# Patient Record
Sex: Female | Born: 1960 | Hispanic: No | Marital: Married | Smoking: Never smoker
Health system: Southern US, Community
[De-identification: ages and names within clinical notes are randomized; demographics above are authoritative.]

## PROBLEM LIST (undated history)

## (undated) DIAGNOSIS — R079 Chest pain, unspecified: Secondary | ICD-10-CM

## (undated) DIAGNOSIS — E785 Hyperlipidemia, unspecified: Secondary | ICD-10-CM

## (undated) DIAGNOSIS — R002 Palpitations: Secondary | ICD-10-CM

## (undated) DIAGNOSIS — T7840XA Allergy, unspecified, initial encounter: Secondary | ICD-10-CM

## (undated) DIAGNOSIS — J45909 Unspecified asthma, uncomplicated: Secondary | ICD-10-CM

## (undated) DIAGNOSIS — J302 Other seasonal allergic rhinitis: Secondary | ICD-10-CM

## (undated) HISTORY — DX: Chest pain, unspecified: R07.9

## (undated) HISTORY — DX: Hyperlipidemia, unspecified: E78.5

## (undated) HISTORY — DX: Allergy, unspecified, initial encounter: T78.40XA

## (undated) HISTORY — DX: Other seasonal allergic rhinitis: J30.2

## (undated) HISTORY — DX: Unspecified asthma, uncomplicated: J45.909

## (undated) HISTORY — DX: Palpitations: R00.2

## (undated) HISTORY — PX: KNEE SURGERY: SHX244

## (undated) HISTORY — PX: TUBAL LIGATION: SHX77

---

## 2000-07-07 ENCOUNTER — Other Ambulatory Visit: Admission: RE | Admit: 2000-07-07 | Discharge: 2000-07-07 | Payer: Self-pay | Admitting: Family Medicine

## 2000-07-13 ENCOUNTER — Encounter: Admission: RE | Admit: 2000-07-13 | Discharge: 2000-07-13 | Payer: Self-pay | Admitting: Family Medicine

## 2000-07-13 ENCOUNTER — Encounter: Payer: Self-pay | Admitting: Family Medicine

## 2002-04-17 ENCOUNTER — Other Ambulatory Visit: Admission: RE | Admit: 2002-04-17 | Discharge: 2002-04-17 | Payer: Self-pay | Admitting: Internal Medicine

## 2003-05-03 HISTORY — PX: ABDOMINAL HYSTERECTOMY: SHX81

## 2003-09-12 ENCOUNTER — Emergency Department (HOSPITAL_COMMUNITY): Admission: EM | Admit: 2003-09-12 | Discharge: 2003-09-12 | Payer: Self-pay | Admitting: Emergency Medicine

## 2004-09-01 ENCOUNTER — Ambulatory Visit: Payer: Self-pay | Admitting: Oncology

## 2004-11-23 ENCOUNTER — Ambulatory Visit: Payer: Self-pay | Admitting: Oncology

## 2005-01-12 ENCOUNTER — Observation Stay (HOSPITAL_COMMUNITY): Admission: RE | Admit: 2005-01-12 | Discharge: 2005-01-13 | Payer: Self-pay | Admitting: Obstetrics and Gynecology

## 2005-02-11 ENCOUNTER — Ambulatory Visit: Payer: Self-pay | Admitting: Oncology

## 2005-04-15 ENCOUNTER — Ambulatory Visit: Payer: Self-pay | Admitting: Oncology

## 2005-07-14 ENCOUNTER — Ambulatory Visit: Payer: Self-pay | Admitting: Oncology

## 2005-09-14 ENCOUNTER — Ambulatory Visit: Payer: Self-pay | Admitting: Oncology

## 2006-04-07 ENCOUNTER — Emergency Department (HOSPITAL_COMMUNITY): Admission: EM | Admit: 2006-04-07 | Discharge: 2006-04-07 | Payer: Self-pay | Admitting: Emergency Medicine

## 2008-05-02 HISTORY — PX: NASAL SINUS SURGERY: SHX719

## 2012-06-22 ENCOUNTER — Ambulatory Visit (INDEPENDENT_AMBULATORY_CARE_PROVIDER_SITE_OTHER): Payer: BC Managed Care – PPO | Admitting: Emergency Medicine

## 2012-06-22 VITALS — BP 120/78 | HR 67 | Temp 97.9°F | Resp 16 | Ht 62.0 in | Wt 151.2 lb

## 2012-06-22 DIAGNOSIS — R002 Palpitations: Secondary | ICD-10-CM

## 2012-06-22 DIAGNOSIS — R079 Chest pain, unspecified: Secondary | ICD-10-CM

## 2012-06-22 LAB — COMPREHENSIVE METABOLIC PANEL
ALT: 9 U/L (ref 0–35)
AST: 17 U/L (ref 0–37)
Albumin: 4.1 g/dL (ref 3.5–5.2)
Alkaline Phosphatase: 58 U/L (ref 39–117)
BUN: 13 mg/dL (ref 6–23)
CO2: 26 mEq/L (ref 19–32)
Calcium: 9.3 mg/dL (ref 8.4–10.5)
Chloride: 105 mEq/L (ref 96–112)
Creat: 0.83 mg/dL (ref 0.50–1.10)
Glucose, Bld: 115 mg/dL — ABNORMAL HIGH (ref 70–99)
Potassium: 3.7 mEq/L (ref 3.5–5.3)
Sodium: 140 mEq/L (ref 135–145)
Total Bilirubin: 0.6 mg/dL (ref 0.3–1.2)
Total Protein: 7.2 g/dL (ref 6.0–8.3)

## 2012-06-22 LAB — POCT CBC
Granulocyte percent: 57.3 %G (ref 37–80)
HCT, POC: 42.6 % (ref 37.7–47.9)
Hemoglobin: 13.8 g/dL (ref 12.2–16.2)
Lymph, poc: 2.3 (ref 0.6–3.4)
MCH, POC: 27 pg (ref 27–31.2)
MCHC: 32.4 g/dL (ref 31.8–35.4)
MCV: 83.3 fL (ref 80–97)
MID (cbc): 0.3 (ref 0–0.9)
MPV: 8.7 fL (ref 0–99.8)
POC Granulocyte: 3.5 (ref 2–6.9)
POC LYMPH PERCENT: 37.1 %L (ref 10–50)
POC MID %: 5.6 %M (ref 0–12)
Platelet Count, POC: 291 10*3/uL (ref 142–424)
RBC: 5.12 M/uL (ref 4.04–5.48)
RDW, POC: 14.5 %
WBC: 6.1 10*3/uL (ref 4.6–10.2)

## 2012-06-22 NOTE — Progress Notes (Signed)
[facility] [address] [place] [address] [phone]  Date:  06/22/2012   Name:  Jody Brooks   DOB:  June 12, 1960   MRN:  [id number]  PCP:  No primary provider on file.    Chief Complaint: Chest Pain   History of Present Illness:  Jody Brooks is a 52 y.o. very pleasant female patient who presents with the following:  Last night had short, seconds only, duration sharp lancinating chest pain.  Associated with period of forceful rapid heartbeats.  No shortness of breath, sweats, nausea or radiation of pain.  Non smoker.  No prior cardiovascular disease, HBP, DM, hyperlipidemia.  No family history of premature MI/death  There is no problem list on file for this patient.   History reviewed. No pertinent past medical history.  Past Surgical History  Procedure Laterality Date  . Abdominal hysterectomy    . Tubal ligation      History  Substance Use Topics  . Smoking status: Never Smoker   . Smokeless tobacco: Not on file  . Alcohol Use: Not on file    Family History  Problem Relation Age of Onset  . Asthma Daughter   . Cancer Brother     No Known Allergies  Medication list has been reviewed and updated.  No current outpatient prescriptions on file prior to visit.   No current facility-administered medications on file prior to visit.    Review of Systems:  As per HPI, otherwise negative.    Physical Examination: Filed Vitals:   06/22/12 1258  BP: 120/78  Pulse: 67  Temp: 97.9 F (36.6 C)  Resp: 16   Filed Vitals:   06/22/12 1258  Height: 5\' 2"  (1.575 m)  Weight: 151 lb 3.2 oz (68.584 kg)   Body mass index is 27.65 kg/(m^2). Ideal Body Weight: Weight in (lb) to have BMI = 25: 136.4  GEN: WDWN, NAD, Non-toxic, A & O x 3 HEENT: Atraumatic, Normocephalic. Neck supple. No masses, No LAD. Ears and Nose: No external deformity. CV: RRR, No M/G/R. No JVD. No thrill. No extra heart sounds. PULM: CTA B, no wheezes,  crackles, rhonchi. No retractions. No resp. distress. No accessory muscle use. ABD: S, NT, ND, +BS. No rebound. No HSM. EXTR: No c/c/e NEURO Normal gait.  PSYCH: Normally interactive. Conversant. Not depressed or anxious appearing.  Calm demeanor.    Assessment and Plan: Palpitations Chest pain Cardiology referral   Carmelina Dane, MD  Results for orders placed in visit on 06/22/12  POCT CBC      Result Value Range   WBC 6.1  4.6 - 10.2 K/uL   Lymph, poc 2.3  0.6 - 3.4   POC LYMPH PERCENT 37.1  10 - 50 %L   MID (cbc) 0.3  0 - 0.9   POC MID % 5.6  0 - 12 %M   POC Granulocyte 3.5  2 - 6.9   Granulocyte percent 57.3  37 - 80 %G   RBC 5.12  4.04 - 5.48 M/uL   Hemoglobin 13.8  12.2 - 16.2 g/dL   HCT, POC 29.5  62.1 - 47.9 %   MCV 83.3  80 - 97 fL   MCH, POC 27.0  27 - 31.2 pg   MCHC 32.4  31.8 - 35.4 g/dL   RDW, POC 30.8     Platelet Count, POC 291  142 - 424 K/uL   MPV 8.7  0 - 99.8 fL

## 2012-06-25 ENCOUNTER — Encounter: Payer: Self-pay | Admitting: *Deleted

## 2013-06-18 ENCOUNTER — Ambulatory Visit (INDEPENDENT_AMBULATORY_CARE_PROVIDER_SITE_OTHER): Payer: BC Managed Care – PPO | Admitting: Emergency Medicine

## 2013-06-18 VITALS — BP 120/80 | HR 88 | Temp 99.3°F | Resp 16 | Ht 62.0 in | Wt 145.0 lb

## 2013-06-18 DIAGNOSIS — J018 Other acute sinusitis: Secondary | ICD-10-CM

## 2013-06-18 MED ORDER — AMOXICILLIN-POT CLAVULANATE 875-125 MG PO TABS: 1.0000 | ORAL_TABLET | Freq: Two times a day (BID) | ORAL | Status: DC

## 2013-06-18 MED ORDER — PSEUDOEPHEDRINE-GUAIFENESIN ER 60-600 MG PO TB12: 1.0000 | ORAL_TABLET | Freq: Two times a day (BID) | ORAL | Status: DC

## 2013-06-18 NOTE — Patient Instructions (Signed)

## 2013-06-18 NOTE — Progress Notes (Signed)
[facility] [address], [place] [address] [phone]  Date:  06/18/2013   Name:  Jody Brooks   DOB:  Mar 23, 1961   MRN:  [id number]  PCP:  No primary provider on file.    Chief Complaint: Cough, runny nose and Pruritis   History of Present Illness:  Jody Brooks is a 53 y.o. very pleasant female patient who presents with the following:  Friday developed a purulent nasal drainage and post nasal drip.  Has a nonproductive cough.  No wheezing or shortness of breath.  No fever or chills.  Fatigue and weakness.  No sick contacts. Has sore throat.  No improvement with over the counter medications or other home remedies.  Has a pruritis on the back of her neck and upper back.  No allergen exposure.   No improvement with over the counter medications or other home remedies. Denies other complaint or health concern today.   There are no active problems to display for this patient.   History reviewed. No pertinent past medical history.  Past Surgical History  Procedure Laterality Date  . Abdominal hysterectomy    . Tubal ligation      History  Substance Use Topics  . Smoking status: Never Smoker   . Smokeless tobacco: Not on file  . Alcohol Use: Not on file    Family History  Problem Relation Age of Onset  . Asthma Daughter   . Cancer Brother     No Known Allergies  Medication list has been reviewed and updated.  No current outpatient prescriptions on file prior to visit.   No current facility-administered medications on file prior to visit.    Review of Systems:  As per HPI, otherwise negative.    Physical Examination: Filed Vitals:   06/18/13 1250  BP: 120/80  Pulse: 88  Temp: 99.3 F (37.4 C)  Resp: 16   Filed Vitals:   06/18/13 1250  Height: 5\' 2"  (1.575 m)  Weight: 145 lb (65.772 kg)   Body mass index is 26.51 kg/(m^2). Ideal Body Weight: Weight in (lb) to have BMI = 25: 136.4  GEN: WDWN, NAD, Non-toxic,  A & O x 3 HEENT: Atraumatic, Normocephalic. Neck supple. No masses, No LAD. Ears and Nose: No external deformity. CV: RRR, No M/G/R. No JVD. No thrill. No extra heart sounds. PULM: CTA B, no wheezes, crackles, rhonchi. No retractions. No resp. distress. No accessory muscle use. ABD: S, NT, ND, +BS. No rebound. No HSM. EXTR: No c/c/e NEURO Normal gait.  PSYCH: Normally interactive. Conversant. Not depressed or anxious appearing.  Calm demeanor.    Assessment and Plan: Sinusitis augmentin mucinex d  Signed,  Ellison Carwin, MD

## 2013-07-25 ENCOUNTER — Ambulatory Visit: Payer: BC Managed Care – PPO

## 2013-07-25 ENCOUNTER — Ambulatory Visit (INDEPENDENT_AMBULATORY_CARE_PROVIDER_SITE_OTHER): Payer: BC Managed Care – PPO | Admitting: Family Medicine

## 2013-07-25 VITALS — BP 124/82 | HR 90 | Temp 97.9°F | Resp 18 | Ht 62.0 in | Wt 148.0 lb

## 2013-07-25 DIAGNOSIS — R0789 Other chest pain: Secondary | ICD-10-CM

## 2013-07-25 DIAGNOSIS — M549 Dorsalgia, unspecified: Secondary | ICD-10-CM

## 2013-07-25 DIAGNOSIS — R071 Chest pain on breathing: Secondary | ICD-10-CM

## 2013-07-25 DIAGNOSIS — M62830 Muscle spasm of back: Secondary | ICD-10-CM

## 2013-07-25 MED ORDER — NAPROXEN 500 MG PO TABS: 500.0000 mg | ORAL_TABLET | Freq: Two times a day (BID) | ORAL | Status: DC

## 2013-07-25 MED ORDER — TRAMADOL HCL 50 MG PO TABS: 50.0000 mg | ORAL_TABLET | Freq: Four times a day (QID) | ORAL | Status: DC | PRN

## 2013-07-25 MED ORDER — KETOROLAC TROMETHAMINE 30 MG/ML IJ SOLN
30.0000 mg | Freq: Once | INTRAMUSCULAR | Status: AC
  Administered 2013-07-25: 30 mg via INTRAMUSCULAR

## 2013-07-25 MED ORDER — METHOCARBAMOL 500 MG PO TABS: ORAL_TABLET | ORAL | Status: DC

## 2013-07-25 NOTE — Progress Notes (Signed)
Subjective: 53 year old lady who was getting ready to go to work. She turned and felt sudden severe pain in the left back, from up in the lower ribs section down to the low back. The pain has continued to persist. This happened at about 9:30, 3-1/2 hours ago. It continues to hurt badly.  No prior back problems. Generally a healthy person. Has had a hysterectomy some years ago.  Objective: Obvious discomfort. Chest clear. Abdomen soft without mass or tenderness. Very painful but only mildly tender in the left low rib cage and down toward the low back. The spine is nontender. Flexion against hurting badly at about 25 or 30 of flexion. Extension does not seem to be a big problem. Right tilt did not hurt much but left tilt causes significant pain. Straight leg raise test negative.  Assessment: Acute onset left posterior lower chest wall and low back pains  Plan: X-ray back Toradol 30 mg IM  [facility] reading (PRIMARY) by  Dr. Linna Darner Mild spastic scoliosis.  No bony abnormalities.  Mild relief starting to come from injection  Assessment: Muscular spasm  Plan  Robaxin Tramadol naprosym   .

## 2013-07-25 NOTE — Patient Instructions (Signed)
Take Robaxin (methocarbamol) one pill 4 times daily at breakfast, lunch, supper, and bedtime if needed for muscle relaxation  Take naproxen 500 mg one twice daily for pain and inflammation  Use the tramadol one every 6 hours only when needed for severe pain not relieved by the other medicines  Alternate using ice pack and heat for about 15 minutes 3 or 4 times daily.  Stay off work through tomorrow. I believe you are off the weekend anyhow. Avoid lifting and straining and twisting activity

## 2014-02-04 ENCOUNTER — Ambulatory Visit (INDEPENDENT_AMBULATORY_CARE_PROVIDER_SITE_OTHER): Payer: BC Managed Care – PPO | Admitting: Family Medicine

## 2014-02-04 ENCOUNTER — Ambulatory Visit (INDEPENDENT_AMBULATORY_CARE_PROVIDER_SITE_OTHER): Payer: BC Managed Care – PPO

## 2014-02-04 VITALS — BP 130/80 | HR 84 | Temp 98.3°F | Resp 16 | Ht 62.0 in | Wt 144.6 lb

## 2014-02-04 DIAGNOSIS — R3129 Other microscopic hematuria: Secondary | ICD-10-CM

## 2014-02-04 DIAGNOSIS — M546 Pain in thoracic spine: Secondary | ICD-10-CM

## 2014-02-04 DIAGNOSIS — R312 Other microscopic hematuria: Secondary | ICD-10-CM

## 2014-02-04 DIAGNOSIS — R109 Unspecified abdominal pain: Secondary | ICD-10-CM

## 2014-02-04 DIAGNOSIS — R0789 Other chest pain: Secondary | ICD-10-CM

## 2014-02-04 DIAGNOSIS — M62830 Muscle spasm of back: Secondary | ICD-10-CM

## 2014-02-04 LAB — POCT URINALYSIS DIPSTICK
Bilirubin, UA: NEGATIVE
Glucose, UA: NEGATIVE
Ketones, UA: NEGATIVE
Leukocytes, UA: NEGATIVE
NITRITE UA: NEGATIVE
PH UA: 7
Protein, UA: NEGATIVE
Spec Grav, UA: 1.02
Urobilinogen, UA: 0.2

## 2014-02-04 LAB — POCT UA - MICROSCOPIC ONLY
CASTS, UR, LPF, POC: NEGATIVE
CRYSTALS, UR, HPF, POC: NEGATIVE
Mucus, UA: NEGATIVE
Yeast, UA: NEGATIVE

## 2014-02-04 MED ORDER — KETOROLAC TROMETHAMINE 60 MG/2ML IM SOLN
60.0000 mg | Freq: Once | INTRAMUSCULAR | Status: AC
Start: 2014-02-04 — End: 2014-02-04
  Administered 2014-02-04: 60 mg via INTRAMUSCULAR

## 2014-02-04 MED ORDER — TRAMADOL HCL 50 MG PO TABS: 50.0000 mg | ORAL_TABLET | Freq: Four times a day (QID) | ORAL | Status: DC | PRN

## 2014-02-04 MED ORDER — NAPROXEN 500 MG PO TABS: 500.0000 mg | ORAL_TABLET | Freq: Two times a day (BID) | ORAL | Status: DC

## 2014-02-04 MED ORDER — METHOCARBAMOL 500 MG PO TABS: ORAL_TABLET | ORAL | Status: DC

## 2014-02-04 NOTE — Patient Instructions (Signed)
Recommend you make an appt for a full physical and have your urine and kidneys rechecked to ensure the small amount of blood in it clears.  Drink MUCH more water - you should be drinking 8 glasses of water a day!!!  Thoracic Strain You have injured the muscles or tendons that attach to the upper part of your back behind your chest. This injury is called a thoracic strain, thoracic sprain, or mid-back strain.  CAUSES  The cause of thoracic strain varies. A less severe injury involves pulling a muscle or tendon without tearing it. A more severe injury involves tearing (rupturing) a muscle or tendon. With less severe injuries, there may be little loss of strength. Sometimes, there are breaks (fractures) in the bones to which the muscles are attached. These fractures are rare, unless there was a direct hit (trauma) or you have weak bones due to osteoporosis or age. Longstanding strains may be caused by overuse or improper form during certain movements. Obesity can also increase your risk for back injuries. Sudden strains may occur due to injury or not warming up properly before exercise. Often, there is no obvious cause for a thoracic strain. SYMPTOMS  The main symptom is pain, especially with movement, such as during exercise. DIAGNOSIS  Your caregiver can usually tell what is wrong by taking an X-ray and doing a physical exam. TREATMENT   Physical therapy may be helpful for recovery. Your caregiver can give you exercises to do or refer you to a physical therapist after your pain improves.  After your pain improves, strengthening and conditioning programs appropriate for your sport or occupation may be helpful.  Always warm up before physical activities or athletics. Stretching after physical activity may also help.  Certain over-the-counter medicines may also help. Ask your caregiver if there are medicines that would help you. If this is your first thoracic strain injury, proper care and proper  healing time before starting activities should prevent long-term problems. Torn ligaments and tendons require as long to heal as broken bones. Average healing times may be only 1 week for a mild strain. For torn muscles and tendons, healing time may be up to 6 weeks to 2 months. HOME CARE INSTRUCTIONS   Apply ice to the injured area. Ice massages may also be used as directed.  Put ice in a plastic bag.  Place a towel between your skin and the bag.  Leave the ice on for 15-20 minutes, 03-04 times a day, for the first 2 days.  Only take over-the-counter or prescription medicines for pain, discomfort, or fever as directed by your caregiver.  Keep your appointments for physical therapy if this was prescribed.  Use wraps and back braces as instructed. SEEK IMMEDIATE MEDICAL CARE IF:   You have an increase in bruising, swelling, or pain.  Your pain has not improved with medicines.  You develop new shortness of breath, chest pain, or fever.  Problems seem to be getting worse rather than better. MAKE SURE YOU:   Understand these instructions.  Will watch your condition.  Will get help right away if you are not doing well or get worse. Document Released: 07/09/2003 Document Revised: 07/11/2011 Document Reviewed: 06/04/2010 [facility] Patient Information 2015 [place], [place]. This information is not intended to replace advice given to you by your health care provider. Make sure you discuss any questions you have with your health care provider.

## 2014-02-04 NOTE — Progress Notes (Signed)
Subjective:    Patient ID: Jody Brooks, female    DOB: Feb 02, 1961, 53 y.o.   MRN: [id number]  This chart was scribed for Shawnee Knapp, MD by Edison Simon, ED Scribe. This patient was seen in room 14 and the patient's care was started at 2:22 PM.   Chief Complaint  Patient presents with  . Back Pain    x 1 day   HPI  HPI Comments: Jody Brooks is a 53 y.o. female who [facility] complaining of lower thoracic back pain with onset this morning. She reports a very similar episode of back pain in March for which she was seen here. She states pain at that time resolved completely after taking a few off of work. She denies other prior history of back pain. She states she has not taken any medication for her symptoms. She states she has not eaten, urinated, or had a bowel movement today; she denies incontinence. She denies fever, chills, numbness, weakness, pain in her legs, or rash.  History reviewed. No pertinent past medical history.  Current Outpatient Prescriptions on File Prior to Visit  Medication Sig Dispense Refill  . methocarbamol (ROBAXIN) 500 MG tablet Take one pill 4 times daily as needed for muscle relaxant  25 tablet  0  . naproxen (NAPROSYN) 500 MG tablet Take 1 tablet (500 mg total) by mouth 2 (two) times daily with a meal.  20 tablet  0  . traMADol (ULTRAM) 50 MG tablet Take 1 tablet (50 mg total) by mouth every 6 (six) hours as needed.  15 tablet  0   No current facility-administered medications on file prior to visit.   No Known Allergies   Review of Systems  Constitutional: Negative for fever and chills.  Musculoskeletal: Positive for back pain.       Denies pain in legs  Skin: Negative for rash.  Neurological: Negative for weakness and numbness.       Denies bowel or bladder incontinence    BP 130/80  Pulse 84  Temp(Src) 98.3 F (36.8 C) (Oral)  Resp 16  Ht 5\' 2"  (1.575 m)  Wt 144 lb 9.6 oz (65.59 kg)  BMI  26.44 kg/m2  SpO2 97%     Objective:   Physical Exam  Nursing note and vitals reviewed. Constitutional: She is oriented to person, place, and time. She appears well-developed and well-nourished.  HENT:  Head: Normocephalic and atraumatic.  Eyes: Conjunctivae are normal.  Neck: Normal range of motion. Neck supple.  Cardiovascular: Normal rate, regular rhythm and normal heart sounds.   No murmur heard. Normal S1 and S2  Pulmonary/Chest: Effort normal.  Abdominal: Soft. She exhibits no distension and no mass. There is no tenderness.  increased bowel sounds  Musculoskeletal: Normal range of motion.  Reports pain in left upper flank in lower thoracic area, No tenderness over thoracic or lumbar spine, no tenderness over lumbar paraspinal muscles  Neurological: She is alert and oriented to person, place, and time.  Skin: Skin is warm and dry.  Psychiatric: She has a normal mood and affect.   Results for orders placed in visit on 02/04/14  POCT URINALYSIS DIPSTICK      Result Value Ref Range   Color, UA yellow     Clarity, UA hazy     Glucose, UA neg     Bilirubin, UA neg     Ketones, UA neg     Spec Grav, UA 1.020  Blood, UA trace-lysed     pH, UA 7.0     Protein, UA neg     Urobilinogen, UA 0.2     Nitrite, UA neg     Leukocytes, UA Negative    POCT UA - MICROSCOPIC ONLY      Result Value Ref Range   WBC, Ur, HPF, POC 1-2     RBC, urine, microscopic 1-6     Bacteria, U Microscopic small     Mucus, UA neg     Epithelial cells, urine per micros 0-3     Crystals, Ur, HPF, POC neg     Casts, Ur, LPF, POC neg     Yeast, UA neg     Renal tubular cells        Primary x-ray reading by Dr. Brigitte Pulse: moderate amount of gas and splenic flexure, lung fields clear, cardiac silhouette normal, no acute rib abnormalities seen under marker   EXAM: LEFT RIBS AND CHEST - 3+ VIEW  COMPARISON: Chest x-ray dated 07/25/2013  FINDINGS: No fracture or other bone lesions are seen involving  the ribs. There is no evidence of pneumothorax or pleural effusion. Both lungs are clear. Heart size and mediastinal contours are within normal limits. Slight thoracic scoliosis, unchanged.  IMPRESSION: No acute abnormality.   Assessment & Plan:   I will write her a work note for the rest of the week with instructions to return on Sunday if pain continues. Left flank pain - Plan: POCT urinalysis dipstick, POCT UA - Microscopic Only, ketorolac (TORADOL) injection 60 mg IM x 1 now, DG Ribs Unilateral W/Chest Left  Hematuria, microscopic - advised pt sched appt for CPE/pelvic and have urine rechecked. Increase h20.  Back spasm - Plan: methocarbamol (ROBAXIN) 500 MG tablet, naproxen (NAPROSYN) 500 MG tablet, traMADol (ULTRAM) 50 MG tablet  Left-sided thoracic back pain - Plan: naproxen (NAPROSYN) 500 MG tablet, traMADol (ULTRAM) 50 MG tablet - reassured that no rib fracture seen now as was over-read previously.  Chest wall pain - Plan: naproxen (NAPROSYN) 500 MG tablet, traMADol (ULTRAM) 50 MG tablet  Meds ordered this encounter  Medications  . ketorolac (TORADOL) injection 60 mg    Sig:   . methocarbamol (ROBAXIN) 500 MG tablet    Sig: Take one pill 4 times daily as needed for muscle relaxant    Dispense:  25 tablet    Refill:  0  . naproxen (NAPROSYN) 500 MG tablet    Sig: Take 1 tablet (500 mg total) by mouth 2 (two) times daily with a meal.    Dispense:  20 tablet    Refill:  0  . traMADol (ULTRAM) 50 MG tablet    Sig: Take 1 tablet (50 mg total) by mouth every 6 (six) hours as needed.    Dispense:  15 tablet    Refill:  0    I personally performed the services described in this documentation, which was scribed in my presence. The recorded information has been reviewed and considered, and addended by me as needed.  Delman Cheadle, MD MPH

## 2014-02-11 ENCOUNTER — Telehealth: Payer: Self-pay | Admitting: Family Medicine

## 2014-02-11 NOTE — Telephone Encounter (Signed)
[employer] has faxed a form for Dr. Brigitte Pulse to complete for patient. 5-7 business days for completion. Return to disabilities when finished.

## 2014-02-12 NOTE — Telephone Encounter (Signed)
PPW faxed, and scanned into Epic

## 2014-02-16 ENCOUNTER — Ambulatory Visit (INDEPENDENT_AMBULATORY_CARE_PROVIDER_SITE_OTHER): Payer: BC Managed Care – PPO | Admitting: Family Medicine

## 2014-02-16 VITALS — BP 120/84 | HR 92 | Temp 98.6°F | Resp 20 | Ht 62.0 in | Wt 144.4 lb

## 2014-02-16 DIAGNOSIS — R059 Cough, unspecified: Secondary | ICD-10-CM

## 2014-02-16 DIAGNOSIS — R05 Cough: Secondary | ICD-10-CM

## 2014-02-16 DIAGNOSIS — J209 Acute bronchitis, unspecified: Secondary | ICD-10-CM

## 2014-02-16 LAB — POCT CBC
Granulocyte percent: 59.1 %G (ref 37–80)
HEMATOCRIT: 45.2 % (ref 37.7–47.9)
Hemoglobin: 13.7 g/dL (ref 12.2–16.2)
LYMPH, POC: 2.9 (ref 0.6–3.4)
MCH: 25 pg — AB (ref 27–31.2)
MCHC: 30.4 g/dL — AB (ref 31.8–35.4)
MCV: 82.2 fL (ref 80–97)
MID (cbc): 0.6 (ref 0–0.9)
MPV: 6.8 fL (ref 0–99.8)
POC Granulocyte: 5 (ref 2–6.9)
POC LYMPH %: 34 % (ref 10–50)
POC MID %: 6.9 % (ref 0–12)
Platelet Count, POC: 293 10*3/uL (ref 142–424)
RBC: 5.5 M/uL — AB (ref 4.04–5.48)
RDW, POC: 15.4 %
WBC: 8.5 10*3/uL (ref 4.6–10.2)

## 2014-02-16 MED ORDER — BENZONATATE 100 MG PO CAPS: 100.0000 mg | ORAL_CAPSULE | Freq: Three times a day (TID) | ORAL | Status: DC | PRN

## 2014-02-16 MED ORDER — ALBUTEROL SULFATE (2.5 MG/3ML) 0.083% IN NEBU
2.5000 mg | INHALATION_SOLUTION | Freq: Once | RESPIRATORY_TRACT | Status: AC
  Administered 2014-02-16: 2.5 mg via RESPIRATORY_TRACT

## 2014-02-16 MED ORDER — HYDROCODONE-HOMATROPINE 5-1.5 MG/5ML PO SYRP: 5.0000 mL | ORAL_SOLUTION | Freq: Every day | ORAL | Status: DC

## 2014-02-16 NOTE — Progress Notes (Signed)
Subjective:    Patient ID: Jody Brooks, female    DOB: November 24, 1960, 53 y.o.   MRN: [id number] This chart was scribed for Delman Cheadle, MD by Marti Sleigh, Medical Scribe. This patient was seen in Room 8 and the patient's care was started a 2:11 PM.  Chief Complaint  Patient presents with  . Cough    x 4 days    HPI  History reviewed. No pertinent past medical history.  No Known Allergies  Current Outpatient Prescriptions on File Prior to Visit  Medication Sig Dispense Refill  . methocarbamol (ROBAXIN) 500 MG tablet Take one pill 4 times daily as needed for muscle relaxant  25 tablet  0  . naproxen (NAPROSYN) 500 MG tablet Take 1 tablet (500 mg total) by mouth 2 (two) times daily with a meal.  20 tablet  0  . traMADol (ULTRAM) 50 MG tablet Take 1 tablet (50 mg total) by mouth every 6 (six) hours as needed.  15 tablet  0   No current facility-administered medications on file prior to visit.    HPI Comments: Jody Brooks is a 53 y.o. female who presents to [facility] complaining of cough that started four days ago. Pt endorses associated CP with cough. Pt denies swelling in legs, SOB or dizziness.  Pt is taking sucrets lozenges and delsym for cough otc. Pt does not smoke.  Pt reported to [facility] two weeks ago when she was seen for thoracic back pain that was thought to be musculoskeletal in etiology. Pt was taken out of work and treated with muscle relaxers, antiinflammatory medication and pain medication. She was incedenally noted to have microscopic hematuria and was advised to increase water intake and have with pelvic exam at follow up appointment. Of note: she did have rib x-ray at that visit, which were normal and showed that her lungs were clear.  Review of Systems  Constitutional: Negative for fever.  Respiratory: Positive for cough and wheezing.   Cardiovascular: Positive for chest pain (on inspiration).  Neurological: Negative for headaches.  Psychiatric/Behavioral:  Negative for agitation.       Objective:  BP 120/84  Pulse 92  Temp(Src) 98.6 F (37 C) (Oral)  Resp 20  Ht 5\' 2"  (1.575 m)  Wt 144 lb 6.4 oz (65.499 kg)  BMI 26.40 kg/m2  SpO2 96%  Physical Exam  Nursing note and vitals reviewed. Constitutional: She is oriented to person, place, and time. She appears well-developed and well-nourished.  HENT:  Head: Normocephalic and atraumatic.  Right Ear: Tympanic membrane and ear canal normal.  Left Ear: Tympanic membrane and ear canal normal.  Nose: Mucosal edema present. No rhinorrhea.  Mouth/Throat: Posterior oropharyngeal erythema present. No oropharyngeal exudate, posterior oropharyngeal edema or tonsillar abscesses.  Eyes: Pupils are equal, round, and reactive to light.  Neck: No JVD present. No thyromegaly present.  Positive cervical and mandibular adenopathy. No posterior cervical or supraclavicular adenopathy.  Cardiovascular: Normal rate and regular rhythm.   Pulmonary/Chest: Effort normal. No respiratory distress. Wheezes: Expiratory with basilar.  Neurological: She is alert and oriented to person, place, and time.  Skin: Skin is warm and dry.  Psychiatric: She has a normal mood and affect. Her behavior is normal.   Results for orders placed in visit on 02/16/14  POCT CBC      Result Value Ref Range   WBC 8.5  4.6 - 10.2 K/uL   Lymph, poc 2.9  0.6 - 3.4   POC LYMPH PERCENT 34.0  10 -  50 %L   MID (cbc) 0.6  0 - 0.9   POC MID % 6.9  0 - 12 %M   POC Granulocyte 5.0  2 - 6.9   Granulocyte percent 59.1  37 - 80 %G   RBC 5.50 (*) 4.04 - 5.48 M/uL   Hemoglobin 13.7  12.2 - 16.2 g/dL   HCT, POC 45.2  37.7 - 47.9 %   MCV 82.2  80 - 97 fL   MCH, POC 25.0 (*) 27 - 31.2 pg   MCHC 30.4 (*) 31.8 - 35.4 g/dL   RDW, POC 15.4     Platelet Count, POC 293  142 - 424 K/uL   MPV 6.8  0 - 99.8 fL        Assessment & Plan:   Cough - Plan: albuterol (PROVENTIL) (2.5 MG/3ML) 0.083% nebulizer solution 2.5 mg, POCT CBC, CANCELED: Culture,  Group A Strep  Acute bronchitis, unspecified organism  Meds ordered this encounter  Medications  . dextromethorphan (DELSYM) 30 MG/5ML liquid    Sig: Take by mouth as needed for cough.  Marland Kitchen albuterol (PROVENTIL) (2.5 MG/3ML) 0.083% nebulizer solution 2.5 mg    Sig:   . benzonatate (TESSALON) 100 MG capsule    Sig: Take 1-2 capsules (100-200 mg total) by mouth 3 (three) times daily as needed for cough.    Dispense:  40 capsule    Refill:  0  . HYDROcodone-homatropine (HYCODAN) 5-1.5 MG/5ML syrup    Sig: Take 5 mLs by mouth at bedtime.    Dispense:  60 mL    Refill:  0    I personally performed the services described in this documentation, which was scribed in my presence. The recorded information has been reviewed and considered, and addended by me as needed.  Delman Cheadle, MD MPH

## 2014-02-16 NOTE — Patient Instructions (Signed)

## 2014-03-06 ENCOUNTER — Encounter: Payer: Self-pay | Admitting: Family Medicine

## 2014-03-07 ENCOUNTER — Other Ambulatory Visit: Payer: Self-pay | Admitting: Physician Assistant

## 2014-03-07 ENCOUNTER — Emergency Department (HOSPITAL_COMMUNITY): Payer: BC Managed Care – PPO

## 2014-03-07 ENCOUNTER — Ambulatory Visit (INDEPENDENT_AMBULATORY_CARE_PROVIDER_SITE_OTHER): Payer: BC Managed Care – PPO

## 2014-03-07 ENCOUNTER — Other Ambulatory Visit: Payer: Self-pay

## 2014-03-07 ENCOUNTER — Emergency Department (HOSPITAL_COMMUNITY)
Admission: EM | Admit: 2014-03-07 | Discharge: 2014-03-07 | Disposition: A | Discharge status: Home / Self Care | Payer: BC Managed Care – PPO | Attending: Emergency Medicine | Admitting: Emergency Medicine

## 2014-03-07 ENCOUNTER — Telehealth: Payer: Self-pay | Admitting: Physician Assistant

## 2014-03-07 ENCOUNTER — Ambulatory Visit (INDEPENDENT_AMBULATORY_CARE_PROVIDER_SITE_OTHER): Payer: BC Managed Care – PPO | Admitting: Family Medicine

## 2014-03-07 ENCOUNTER — Encounter (HOSPITAL_COMMUNITY): Payer: Self-pay | Admitting: Emergency Medicine

## 2014-03-07 VITALS — BP 110/74 | HR 112 | Temp 99.6°F | Resp 16 | Ht 62.0 in | Wt 143.2 lb

## 2014-03-07 DIAGNOSIS — R000 Tachycardia, unspecified: Secondary | ICD-10-CM | POA: Insufficient documentation

## 2014-03-07 DIAGNOSIS — R05 Cough: Secondary | ICD-10-CM | POA: Diagnosis present

## 2014-03-07 DIAGNOSIS — R51 Headache: Secondary | ICD-10-CM

## 2014-03-07 DIAGNOSIS — E041 Nontoxic single thyroid nodule: Secondary | ICD-10-CM | POA: Insufficient documentation

## 2014-03-07 DIAGNOSIS — R519 Headache, unspecified: Secondary | ICD-10-CM

## 2014-03-07 DIAGNOSIS — J471 Bronchiectasis with (acute) exacerbation: Secondary | ICD-10-CM | POA: Insufficient documentation

## 2014-03-07 DIAGNOSIS — Z791 Long term (current) use of non-steroidal anti-inflammatories (NSAID): Secondary | ICD-10-CM | POA: Insufficient documentation

## 2014-03-07 DIAGNOSIS — R059 Cough, unspecified: Secondary | ICD-10-CM

## 2014-03-07 DIAGNOSIS — R0602 Shortness of breath: Secondary | ICD-10-CM

## 2014-03-07 LAB — D-DIMER, QUANTITATIVE: D-Dimer, Quant: 0.56 ug/mL-FEU — ABNORMAL HIGH (ref 0.00–0.48)

## 2014-03-07 LAB — COMPREHENSIVE METABOLIC PANEL
ALT: 14 U/L (ref 0–35)
AST: 24 U/L (ref 0–37)
Albumin: 3.8 g/dL (ref 3.5–5.2)
Alkaline Phosphatase: 57 U/L (ref 39–117)
Anion gap: 13 (ref 5–15)
BUN: 15 mg/dL (ref 6–23)
CALCIUM: 9.5 mg/dL (ref 8.4–10.5)
CO2: 26 mEq/L (ref 19–32)
CREATININE: 1.01 mg/dL (ref 0.50–1.10)
Chloride: 101 mEq/L (ref 96–112)
GFR calc Af Amer: 72 mL/min — ABNORMAL LOW (ref >90)
GFR calc non Af Amer: 62 mL/min — ABNORMAL LOW (ref >90)
Glucose, Bld: 105 mg/dL — ABNORMAL HIGH (ref 70–99)
Potassium: 3.9 mEq/L (ref 3.7–5.3)
Sodium: 140 mEq/L (ref 137–147)
TOTAL PROTEIN: 8.2 g/dL (ref 6.0–8.3)
Total Bilirubin: 0.3 mg/dL (ref 0.3–1.2)

## 2014-03-07 LAB — POCT CBC
GRANULOCYTE PERCENT: 57.6 % (ref 37–80)
HCT, POC: 43.5 % (ref 37.7–47.9)
Hemoglobin: 13.7 g/dL (ref 12.2–16.2)
Lymph, poc: 1.9 (ref 0.6–3.4)
MCH, POC: 26.1 pg — AB (ref 27–31.2)
MCHC: 31.5 g/dL — AB (ref 31.8–35.4)
MCV: 82.9 fL (ref 80–97)
MID (CBC): 0.6 (ref 0–0.9)
MPV: 7.1 fL (ref 0–99.8)
POC Granulocyte: 3.4 (ref 2–6.9)
POC LYMPH PERCENT: 32.2 %L (ref 10–50)
POC MID %: 10.2 % (ref 0–12)
Platelet Count, POC: 276 10*3/uL (ref 142–424)
RBC: 5.25 M/uL (ref 4.04–5.48)
RDW, POC: 16.1 %
WBC: 5.9 10*3/uL (ref 4.6–10.2)

## 2014-03-07 LAB — CBC WITH DIFFERENTIAL/PLATELET
Basophils Absolute: 0 10*3/uL (ref 0.0–0.1)
Basophils Relative: 0 % (ref 0–1)
Eosinophils Absolute: 0.1 10*3/uL (ref 0.0–0.7)
Eosinophils Relative: 2 % (ref 0–5)
HEMATOCRIT: 41.8 % (ref 36.0–46.0)
Hemoglobin: 13.5 g/dL (ref 12.0–15.0)
Lymphocytes Relative: 38 % (ref 12–46)
Lymphs Abs: 1.8 10*3/uL (ref 0.7–4.0)
MCH: 26.5 pg (ref 26.0–34.0)
MCHC: 32.3 g/dL (ref 30.0–36.0)
MCV: 82 fL (ref 78.0–100.0)
MONO ABS: 0.5 10*3/uL (ref 0.1–1.0)
Monocytes Relative: 11 % (ref 3–12)
NEUTROS ABS: 2.3 10*3/uL (ref 1.7–7.7)
Neutrophils Relative %: 49 % (ref 43–77)
Platelets: 247 10*3/uL (ref 150–400)
RBC: 5.1 MIL/uL (ref 3.87–5.11)
RDW: 14.9 % (ref 11.5–15.5)
WBC: 4.8 10*3/uL (ref 4.0–10.5)

## 2014-03-07 LAB — I-STAT TROPONIN, ED: Troponin i, poc: 0 ng/mL (ref 0.00–0.08)

## 2014-03-07 MED ORDER — ALBUTEROL SULFATE HFA 108 (90 BASE) MCG/ACT IN AERS: 1.0000 | INHALATION_SPRAY | Freq: Four times a day (QID) | RESPIRATORY_TRACT | Status: DC | PRN

## 2014-03-07 MED ORDER — ACETAMINOPHEN 500 MG PO TABS
1000.0000 mg | ORAL_TABLET | Freq: Once | ORAL | Status: AC
  Administered 2014-03-07: 1000 mg via ORAL
  Filled 2014-03-07: qty 2

## 2014-03-07 MED ORDER — PREDNISONE 20 MG PO TABS: ORAL_TABLET | ORAL | Status: DC

## 2014-03-07 MED ORDER — GUAIFENESIN ER 1200 MG PO TB12: 1.0000 | ORAL_TABLET | Freq: Two times a day (BID) | ORAL | Status: DC | PRN

## 2014-03-07 MED ORDER — IOHEXOL 350 MG/ML SOLN
100.0000 mL | Freq: Once | INTRAVENOUS | Status: AC | PRN
  Administered 2014-03-07: 100 mL via INTRAVENOUS

## 2014-03-07 MED ORDER — HYDROCOD POLST-CHLORPHEN POLST 10-8 MG/5ML PO LQCR: 5.0000 mL | Freq: Every evening | ORAL | Status: DC | PRN

## 2014-03-07 MED ORDER — ALBUTEROL SULFATE (2.5 MG/3ML) 0.083% IN NEBU
5.0000 mg | INHALATION_SOLUTION | Freq: Once | RESPIRATORY_TRACT | Status: AC
  Administered 2014-03-07: 5 mg via RESPIRATORY_TRACT
  Filled 2014-03-07: qty 6

## 2014-03-07 MED ORDER — SODIUM CHLORIDE 0.9 % IV BOLUS (SEPSIS)
1000.0000 mL | Freq: Once | INTRAVENOUS | Status: AC
  Administered 2014-03-07: 1000 mL via INTRAVENOUS

## 2014-03-07 MED ORDER — ACETAMINOPHEN 500 MG PO TABS: 1000.0000 mg | ORAL_TABLET | Freq: Three times a day (TID) | ORAL | Status: DC | PRN

## 2014-03-07 MED ORDER — BENZONATATE 100 MG PO CAPS: 100.0000 mg | ORAL_CAPSULE | Freq: Three times a day (TID) | ORAL | Status: DC | PRN

## 2014-03-07 MED ORDER — AZITHROMYCIN 250 MG PO TABS: ORAL_TABLET | ORAL | Status: DC

## 2014-03-07 NOTE — ED Notes (Signed)
Pt arrived to the ED from her Doctors office with a need to rule out a pulmonary embolism.  Pt states she has had a cough for 4 weeks.  Pt has been on medications but is unable to name them.  Pt states she coughs so hard she has shortness of breath.  Pt states that she has generalized body aches and dizziness as well.

## 2014-03-07 NOTE — Progress Notes (Signed)
IDENTIFYING INFORMATION  Jody Brooks / DOB: Jul 13, 1960 / MRN: [id number]  The patient has no active problems on her problem list.   SUBJECTIVE  Chief Complaint: Cough; Generalized Body Aches; and Headache   History of present illness: Jody Brooks is a 53 y.o. year old female who presents for an illness that started about 14 days ago with cough and became worse last night.  She feels her cough is getting worse and as of last night she began to have body aches, subjective fever, chills, and a "heavy" headache, along with head congestion.  She had a flu shot last week, and thinks she may have gotten the flu from that. She has tried some "pain away" which she thinks has aspirin in it which did not really help her symptoms.   She states that her coughing has become so bad that she has started to have some SOB. She reports that she is anemic, however her lab work does not reveal an anemia.  She denies chest pain at this time, reports no long car rides or plane trips, and denies recent surgery.  She denies a history of blood dyscrasia.    She  has no past medical history on file.  The patient has a current medication list which includes the following prescription(s): methocarbamol, naproxen, tramadol, acetaminophen, benzonatate, chlorpheniramine-hydrocodone, and guaifenesin.  Jody Brooks has No Known Allergies. She  reports that she has never smoked. She does not have any smokeless tobacco history on file. She reports that she does not drink alcohol or use illicit drugs. She  has no sexual activity history on file.  The patient  has past surgical history that includes Abdominal hysterectomy and Tubal ligation.  Her family history includes Asthma in her daughter; Cancer in her brother.  Review of Systems  Constitutional: Positive for fever, chills and malaise/fatigue. Negative for diaphoresis.  HENT: Positive for congestion. Negative for sore throat.   Eyes: Negative.     Respiratory: Positive for cough and sputum production. Negative for hemoptysis, shortness of breath and wheezing.   Cardiovascular: Negative for chest pain, palpitations, claudication, leg swelling and PND.  Gastrointestinal: Negative.   Genitourinary: Negative.   Musculoskeletal: Negative.   Skin: Negative.   Neurological: Positive for weakness and headaches. Negative for dizziness and speech change.  Psychiatric/Behavioral: Negative.     OBJECTIVE  Blood pressure 110/74, pulse 112, temperature 99.6 F (37.6 C), temperature source Oral, resp. rate 16, height 5\' 2"  (1.575 m), weight 143 lb 3.2 oz (64.955 kg), SpO2 99 %. The patient's body mass index is 26.18 kg/(m^2).  Physical Exam  Constitutional: She is oriented to person, place, and time and well-developed, well-nourished, and in no distress.  HENT:  Head: Normocephalic.  Right Ear: External ear normal.  Left Ear: External ear normal.  Mouth/Throat: No oropharyngeal exudate.  Eyes: Conjunctivae and EOM are normal. Pupils are equal, round, and reactive to light.  Neck: Normal range of motion. Neck supple. No JVD present. No tracheal deviation present. No thyromegaly present.  Cardiovascular: Regular rhythm, normal heart sounds and intact distal pulses.  Exam reveals no gallop and no friction rub.   No murmur heard. Pulmonary/Chest: Effort normal and breath sounds normal. No accessory muscle usage. No respiratory distress. She has no wheezes. She has no rales. She exhibits no tenderness.  Abdominal: Soft. Bowel sounds are normal.  Musculoskeletal: Normal range of motion.  Lymphadenopathy:    She has no cervical adenopathy.  Neurological: She is alert and oriented to  person, place, and time. Gait normal.  Skin: Skin is warm and dry. No rash noted. No erythema. No pallor.  Psychiatric: Mood, memory, affect and judgment normal.  xtremities: Positive for left calf tenderness and Homans' sign.  Right calf is negative for tenderness  and Homans'.  Lower leg circumference equal bilaterally.  Negative for erythema bilaterally.    Results for orders placed or performed in visit on 03/07/14 (from the past 24 hour(s))  POCT CBC     Status: Abnormal   Collection Time: 03/07/14  3:10 PM  Result Value Ref Range   WBC 5.9 4.6 - 10.2 K/uL   Lymph, poc 1.9 0.6 - 3.4   POC LYMPH PERCENT 32.2 10 - 50 %L   MID (cbc) 0.6 0 - 0.9   POC MID % 10.2 0 - 12 %M   POC Granulocyte 3.4 2 - 6.9   Granulocyte percent 57.6 37 - 80 %G   RBC 5.25 4.04 - 5.48 M/uL   Hemoglobin 13.7 12.2 - 16.2 g/dL   HCT, POC 43.5 37.7 - 47.9 %   MCV 82.9 80 - 97 fL   MCH, POC 26.1 (A) 27 - 31.2 pg   MCHC 31.5 (A) 31.8 - 35.4 g/dL   RDW, POC 16.1 %   Platelet Count, POC 276 142 - 424 K/uL   MPV 7.1 0 - 99.8 fL  D-dimer, quantitative     Status: Abnormal   Collection Time: 03/07/14  3:59 PM  Result Value Ref Range   D-Dimer, Quant 0.56 (H) 0.00 - 0.48 ug/mL-FEU   Narrative   Performed at:  [address], [address]                [place], [place] [address]    [facility] reading (PRIMARY) by  Dr. Lorelei Brooks: Negative for consolidation. No acute findings.     ASSESSMENT & PLAN  Jody Brooks was seen today for cough, generalized body aches and headache.  Diagnoses and associated orders for this visit:  Cough - POCT CBC - DG Chest 2 View; Future - benzonatate (TESSALON) 100 MG capsule; Take 1-2 capsules (100-200 mg total) by mouth 3 (three) times daily as needed for cough. - chlorpheniramine-HYDROcodone (TUSSIONEX PENNKINETIC ER) 10-8 MG/5ML LQCR; Take 5 mLs by mouth at bedtime as needed for cough (cough). - Guaifenesin (MUCINEX MAXIMUM STRENGTH) 1200 MG TB12; Take 1 tablet (1,200 mg total) by mouth every 12 (twelve) hours as needed.  Malaise and fatigue - Comprehensive metabolic panel  Tachycardia persistent on re-exam - D-dimer, quantitative -     Patient called at 7:50 p and advised of positive D-Dimer.  Advised she go  to emergency room tonight for PE rule out.  She reports that she will go to [facility].    Headache, unspecified headache type - acetaminophen (TYLENOL) 500 MG tablet; Take 2 tablets (1,000 mg total) by mouth every 8 (eight) hours as needed for mild pain, fever or headache. Take 2 tabs every 8 hours.     The patient was instructed to to call or comeback to clinic as needed, or should symptoms warrant.  [name], MHS, PA-C [facility] 03/07/2014 7:51 PM

## 2014-03-07 NOTE — Discharge Instructions (Signed)
Take zpack as prescribed.   Take prednisone as prescribed.   Use albuterol as needed.   Follow up with your doctor. You can ask your doctor about referral to pulmonology.   Return to ER if you have trouble breathing, shortness of breath, worse cough.

## 2014-03-07 NOTE — Telephone Encounter (Signed)
Called to advise that patient present to the ED given high heart rate and positive, mild SOB, and +D-Dimer.  She reports that she will go to [facility].  [name], MS, PA-C   7:50 PM, 03/07/2014

## 2014-03-07 NOTE — ED Notes (Signed)
EKG given to EDP,Yao,MD., for review. 

## 2014-03-07 NOTE — ED Provider Notes (Signed)
CSN: [id number]     Arrival date & time 03/07/14  2022 History   First MD Initiated Contact with Patient 03/07/14 2132     Chief Complaint  Patient presents with  . Cough     (Consider location/radiation/quality/duration/timing/severity/associated sxs/prior Treatment) The history is provided by the patient.  Jody Brooks is a 53 y.o. female hx of hysterectomy, here presenting with shortness of breath and cough.has been having nonproductive cough for the last 4 weeks. She saw her doctor 3 weeks ago was given cough medicine and Hycodan and improved initially. However for the last several days the cough came back and she went back to her doctor today and was prescribed more medicine and a d-dimer was ordered and was positive. She was sent here for rule out possible pulmonary embolus. Denies any recent travel or leg swelling or history of PEs. Does have general body aches and low-grade fever at home. Denies any history of COPD or asthma and she is never smoker.    History reviewed. No pertinent past medical history. Past Surgical History  Procedure Laterality Date  . Abdominal hysterectomy    . Tubal ligation     Family History  Problem Relation Age of Onset  . Asthma Daughter   . Cancer Brother    History  Substance Use Topics  . Smoking status: Never Smoker   . Smokeless tobacco: Not on file  . Alcohol Use: No   OB History    No data available     Review of Systems  Respiratory: Positive for cough and shortness of breath.   All other systems reviewed and are negative.     Allergies  Review of patient's allergies indicates no known allergies.  Home Medications   Prior to Admission medications   Medication Sig Start Date End Date Taking? Authorizing Provider  acetaminophen (TYLENOL) 500 MG tablet Take 1,000 mg by mouth every 6 (six) hours as needed for mild pain.   Yes Historical Provider, MD  chlorpheniramine-HYDROcodone (TUSSIONEX PENNKINETIC ER) 10-8 MG/5ML  LQCR Take 5 mLs by mouth at bedtime as needed for cough (cough). 03/07/14  Yes Kathlen Brunswick, PA-C  Guaifenesin [facility] MAXIMUM STRENGTH) 1200 MG TB12 Take 1 tablet (1,200 mg total) by mouth every 12 (twelve) hours as needed. Patient taking differently: Take 1 tablet by mouth every 12 (twelve) hours as needed. Congestion 03/07/14  Yes Kathlen Brunswick, PA-C  HYDROcodone-homatropine [facility]) 5-1.5 MG/5ML syrup Take 5 mLs by mouth every 6 (six) hours as needed for cough.  02/16/14  Yes Historical Provider, MD  ibuprofen (ADVIL,MOTRIN) 200 MG tablet Take 400 mg by mouth every 6 (six) hours as needed for moderate pain.   Yes Historical Provider, MD  methocarbamol (ROBAXIN) 500 MG tablet Take one pill 4 times daily as needed for muscle relaxant 02/04/14  Yes Shawnee Knapp, MD  naproxen (NAPROSYN) 500 MG tablet Take 1 tablet (500 mg total) by mouth 2 (two) times daily with a meal. 02/04/14  Yes Shawnee Knapp, MD  traMADol (ULTRAM) 50 MG tablet Take 1 tablet (50 mg total) by mouth every 6 (six) hours as needed. 02/04/14  Yes Shawnee Knapp, MD  acetaminophen (TYLENOL) 500 MG tablet Take 2 tablets (1,000 mg total) by mouth every 8 (eight) hours as needed for mild pain, fever or headache. Take 2 tabs every 8 hours. 03/07/14   Kathlen Brunswick, PA-C  benzonatate (TESSALON) 100 MG capsule Take 1-2 capsules (100-200 mg total) by mouth 3 (three) times daily  as needed for cough. 03/07/14   Kathlen Brunswick, PA-C   BP 123/71 mmHg  Pulse 111  Temp(Src) 99.1 F (37.3 C) (Oral)  Resp 18  SpO2 97% Physical Exam  Constitutional: She is oriented to person, place, and time.  Tachypneic, slightly uncomfortable   HENT:  Head: Normocephalic.  MM slightly dry   Eyes: Conjunctivae and EOM are normal. Pupils are equal, round, and reactive to light.  Neck: Normal range of motion. Neck supple.  Cardiovascular: Regular rhythm and normal heart sounds.   Mildly tachy   Pulmonary/Chest: Effort normal.  Mild wheezing L side. No  crackles   Abdominal: Soft. Bowel sounds are normal. She exhibits no distension. There is no tenderness. There is no rebound.  Musculoskeletal: Normal range of motion. She exhibits no edema or tenderness.  Neurological: She is alert and oriented to person, place, and time. No cranial nerve deficit. Coordination normal.  Skin: Skin is warm and dry.  Psychiatric: She has a normal mood and affect. Her behavior is normal. Judgment and thought content normal.  Nursing note and vitals reviewed.   ED Course  Procedures (including critical care time) Labs Review Labs Reviewed  COMPREHENSIVE METABOLIC PANEL - Abnormal; Notable for the following:    Glucose, Bld 105 (*)    GFR calc non Af Amer 62 (*)    GFR calc Af Amer 72 (*)    All other components within normal limits  CBC WITH DIFFERENTIAL  Randolm Idol, ED    Imaging Review Dg Chest 2 View  03/07/2014   CLINICAL DATA:  Cough.  Patient has a 2 week history of cough.  EXAM: CHEST  2 VIEW  COMPARISON:  02/04/2014.  FINDINGS: Cardiac silhouette is normal in size. Normal mediastinal and hilar contours. Clear lungs. No pleural effusion or pneumothorax. Mild dextroscoliosis of the mid thoracic spine, stable.  IMPRESSION: No active cardiopulmonary disease.   Electronically Signed   By: Lajean Manes M.D.   On: 03/07/2014 15:55   Ct Angio Chest Pe W/cm &/or Wo Cm  03/07/2014   CLINICAL DATA:  Cough for 4 weeks. Generalized body aches in weakness. Concern for pulmonary embolism.  EXAM: CT ANGIOGRAPHY CHEST WITH CONTRAST  TECHNIQUE: Multidetector CT imaging of the chest was performed using the standard protocol during bolus administration of intravenous contrast. Multiplanar CT image reconstructions and MIPs were obtained to evaluate the vascular anatomy.  CONTRAST:  139mL OMNIPAQUE IOHEXOL 350 MG/ML SOLN  COMPARISON:  Radiograph 03/07/2014  FINDINGS: No filling defects within the pulmonary arteries to suggest acute pulmonary embolism. No acute  findings aorta great vessels. No pericardial fluid.  No pneumothorax or pleural fluid. No infarction or infiltrate. Mild bronchiectasis in the lower lobes.  No axillary or supraclavicular lymphadenopathy. 16 mm nodule in the left lobe of thyroid gland.  No mediastinal adenopathy. Mild hilar adenopathy. Esophagus is normal. Limited view of the upper abdomen is unremarkable. Limited view of the skeleton unremarkable appear  Review of the MIP images confirms the above findings.  IMPRESSION: 1. No evidence of acute pulmonary embolism. 2. 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 3. Nodule within the left lobe of thyroid gland. Consider thyroid ultrasound for further characterization.   Electronically Signed   By: Suzy Bouchard M.D.   On: 03/07/2014 23:29     EKG Interpretation   Date/Time:  Friday March 07 2014 21:42:41 EST Ventricular Rate:  102 PR Interval:  176 QRS Duration: 79 QT Interval:  342  QTC Calculation: 445 R Axis:   65 Text Interpretation:  Sinus tachycardia No previous ECGs available  Confirmed by YAO  MD, DAVID ([id number]) on 03/07/2014 9:44:24 PM      MDM   Final diagnoses:  Shortness of breath    Jody Brooks is a 53 y.o. female here with cough, low grade temp. I think likely bronchitis vs atypical pneumonia. Less likely to be PE. Given positive d-dimer, will get ct angio. Will get labs, hydrate, give albuterol and reassess.   11:40 PM Wheezing improved with albuterol. CT showed bronchiectasis from chronic lung disease. Will try a short course of steroids and zpack. Recommend pulmonary f/u outpatient.     Wandra Arthurs, MD 03/07/14 [phone]

## 2014-03-08 LAB — COMPREHENSIVE METABOLIC PANEL
ALBUMIN: 4.1 g/dL (ref 3.5–5.2)
ALK PHOS: 53 U/L (ref 39–117)
ALT: 14 U/L (ref 0–35)
AST: 20 U/L (ref 0–37)
BUN: 9 mg/dL (ref 6–23)
CALCIUM: 9.3 mg/dL (ref 8.4–10.5)
CHLORIDE: 98 meq/L (ref 96–112)
CO2: 24 mEq/L (ref 19–32)
Creat: 0.89 mg/dL (ref 0.50–1.10)
Glucose, Bld: 87 mg/dL (ref 70–99)
POTASSIUM: 4.1 meq/L (ref 3.5–5.3)
SODIUM: 136 meq/L (ref 135–145)
TOTAL PROTEIN: 7.4 g/dL (ref 6.0–8.3)
Total Bilirubin: 0.4 mg/dL (ref 0.2–1.2)

## 2014-03-10 ENCOUNTER — Telehealth: Payer: Self-pay

## 2014-03-10 NOTE — Telephone Encounter (Signed)
Please ask patient to come back to clinic tomorrow, preferably early.  Her pulmonary angiogram showed some mild lung findings along with a thyroid nodule. There is no TSH on record that I can find.   I think its best that we evaluate her in 102 to best decide where we will refer her.  She may require and FNA of the thyroid nodule, in which case an Endocrinologist would also be appropriate.

## 2014-03-10 NOTE — Telephone Encounter (Signed)
Pt saw PA Clark on 11/6, and was sent out by EMS. Pt state he now needs a referral to a lung specialists. Please advise pt

## 2014-03-11 ENCOUNTER — Ambulatory Visit (INDEPENDENT_AMBULATORY_CARE_PROVIDER_SITE_OTHER): Payer: BC Managed Care – PPO | Admitting: Internal Medicine

## 2014-03-11 ENCOUNTER — Telehealth: Payer: Self-pay | Admitting: *Deleted

## 2014-03-11 VITALS — BP 118/82 | HR 77 | Temp 98.1°F | Resp 18 | Ht 62.0 in | Wt 146.0 lb

## 2014-03-11 DIAGNOSIS — J984 Other disorders of lung: Secondary | ICD-10-CM

## 2014-03-11 DIAGNOSIS — E041 Nontoxic single thyroid nodule: Secondary | ICD-10-CM

## 2014-03-11 NOTE — Telephone Encounter (Signed)
Left a message for patient to return call.  We need him to return to clinic to have blood drawn.  He left today without it being done.

## 2014-03-11 NOTE — Telephone Encounter (Signed)
I spoke w/pt and she agreed to RTC today. She stated she will try to get here in about 1/2 hr.

## 2014-03-11 NOTE — Progress Notes (Signed)
IDENTIFYING INFORMATION  Jody Brooks / DOB: 04/12/1961 / MRN: [id number]  The patient  does not have a problem list on file.  SUBJECTIVE  Chief Complaint: thyroid check   History of present illness: Jody Brooks is a 53 y.o. year old female who presents for a follow up pulmonary CT angiogram, the results of which are included in this report. Per the CT report, the patient is concerned about a thyroid nodule finding, along with her cough, which has been ongoing for roughly 4-5 weeks now. She denies changes in skin and hair, and denies changes in defecation frequency.   She reports that her cough is getting slightly better now, and she has been taking the medications provided by the ED, which include azithromycin and prednisone. She denies rash on the shins and face. She reports still feeling poorly though, and states that she gets lightheaded when she stands up. She admits that she should be drinking more water. For this problem she would like to be referred to a pulmonologist.  She  has no past medical history on file. The patient has a current medication list which includes the following prescription(s): acetaminophen, acetaminophen, albuterol, azithromycin, benzonatate, chlorpheniramine-hydrocodone, guaifenesin, hydrocodone-homatropine, ibuprofen, methocarbamol, naproxen, prednisone, and tramadol.  Ms. Jody Brooks has No Known Allergies. She  reports that she has never smoked. She does not have any smokeless tobacco history on file. She reports that she does not drink alcohol or use illicit drugs. She  has no sexual activity history on file.  The patient  has past surgical history that includes Abdominal hysterectomy and Tubal ligation. Her family history includes Asthma in her daughter; Cancer in her brother.  Review of Systems  Constitutional: Negative for fever, chills and weight loss.  HENT: Negative.  Eyes: Negative.  Respiratory: Positive for cough and  sputum production.  Cardiovascular: Negative.  Skin: Negative.    OBJECTIVE  Blood pressure 118/82, pulse 77, temperature 98.1 F (36.7 C), temperature source Oral, resp. rate 18, height 5\' 2"  (1.575 m), weight 146 lb (66.225 kg), SpO2 96 %, peak flow 360. The patient's body mass index is 26.7 kg/(m^2).  Physical Exam  Constitutional: She is oriented to person, place, and time and well-developed, well-nourished, and in no distress.  HENT:  Head: Normocephalic.  Eyes: Conjunctivae and EOM are normal. Pupils are equal, round, and reactive to light.  Neck: No thyromegaly present.  Cardiovascular: Normal rate, regular rhythm and normal heart sounds.   Pulmonary/Chest: Effort normal and breath sounds normal. No accessory muscle usage. No respiratory distress. She has no decreased breath sounds.  Abdominal: Soft. She exhibits no distension.  Musculoskeletal: Normal range of motion.  Lymphadenopathy:    She has no cervical adenopathy.  Neurological: She is alert and oriented to person, place, and time.  Skin: Skin is warm and dry.  Psychiatric: Mood, memory, affect and judgment normal.   Peak flow today at 360L/min, 92% of predicted  CT Angiogram on 03/07/2014  No filling defects within the pulmonary arteries to suggest acute pulmonary embolism. No acute findings aorta great vessels. No pericardial fluid.  No pneumothorax or pleural fluid. No infarction or infiltrate. Mild bronchiectasis in the lower lobes.  No axillary or supraclavicular lymphadenopathy. 16 mm nodule in the left lobe of thyroid gland.  No mediastinal adenopathy. Mild hilar adenopathy. Esophagus is normal. Limited view of the upper abdomen is unremarkable. Limited view of the skeleton unremarkable appear  Review of the MIP images confirms the above findings.  IMPRESSION: 1. No evidence of acute pulmonary embolism. 2. Basilar bronchiectasis and mild adenopathy is likely related to chronic lung  disease. 3. Nodule within the left lobe of thyroid gland. Consider thyroid ultrasound for further characterization.   Electronically Signed  By: Suzy Bouchard M.D.  On: 03/07/2014 23:29  ASSESSMENT & PLAN  Jody Brooks was seen today for thyroid check.  Diagnoses and associated orders for this visit:  Thyroid nodule: Incidentaloma found on CT angiogram.  Will follow.   -TSH -T4, Free -US Soft Tissue Head/Neck; Future  Chronic lung disease:Also found on CT angiogram. Peak flow is wnl.  Per patient's request we will send to pulmonology.  -Ambulatory referral to Pulmonology -     Sedimentation rate (add on)   The patient was instructed to to call or comeback to clinic as needed, or should symptoms warrant.  [name], MHS, PA-C [facility] 03/11/2014 3:03 PM  I have participated in the care of this patient with the Advanced Practice Provider and agree with Diagnosis and Plan as documented. [name] Pastor, M.D.

## 2014-03-12 LAB — T4, FREE: FREE T4: 1.19 ng/dL (ref 0.80–1.80)

## 2014-03-12 LAB — TSH: TSH: 0.447 u[IU]/mL (ref 0.350–4.500)

## 2014-03-17 ENCOUNTER — Ambulatory Visit
Admission: RE | Admit: 2014-03-17 | Discharge: 2014-03-17 | Disposition: A | Discharge status: Home / Self Care | Payer: BC Managed Care – PPO | Source: Ambulatory Visit | Attending: Physician Assistant | Admitting: Physician Assistant

## 2014-03-17 DIAGNOSIS — E041 Nontoxic single thyroid nodule: Secondary | ICD-10-CM

## 2014-03-18 ENCOUNTER — Telehealth: Payer: Self-pay | Admitting: Physician Assistant

## 2014-03-18 ENCOUNTER — Encounter: Payer: Self-pay | Admitting: Internal Medicine

## 2014-03-18 ENCOUNTER — Ambulatory Visit (INDEPENDENT_AMBULATORY_CARE_PROVIDER_SITE_OTHER): Payer: BC Managed Care – PPO | Admitting: Internal Medicine

## 2014-03-18 VITALS — BP 106/80 | HR 68 | Temp 99.1°F | Ht 62.0 in | Wt 146.8 lb

## 2014-03-18 DIAGNOSIS — J479 Bronchiectasis, uncomplicated: Secondary | ICD-10-CM | POA: Insufficient documentation

## 2014-03-18 DIAGNOSIS — Z23 Encounter for immunization: Secondary | ICD-10-CM

## 2014-03-18 DIAGNOSIS — E041 Nontoxic single thyroid nodule: Secondary | ICD-10-CM

## 2014-03-18 MED ORDER — AMOXICILLIN-POT CLAVULANATE 875-125 MG PO TABS: 1.0000 | ORAL_TABLET | Freq: Two times a day (BID) | ORAL | Status: DC

## 2014-03-18 NOTE — Patient Instructions (Signed)
Bronchiectasis =   you have scarring of your bronchial tubes which means that they don't function perfectly normally and mucus tends to pool in certain areas of your lung which can cause pneumonia and further scarring of your lung and bronchial tubes  Whenever you develop cough congestion take mucinex or mucinex dm up to 1200 mg every 12 hours > these will help keep the mucus loose and flowing but if your condition worsens you need to seek help immediately preferably here or somewhere inside the [facility] system to compare xrays ( worse = darker or bloody mucus or pain on breathing in)   Augmentin 875 mg take one pill twice daily  X 10 days - take at breakfast and supper with large glass of water.  It would help reduce the usual side effects (diarrhea and yeast infections) if you ate cultured yogurt at lunch.   Please see patient coordinator before you leave today  to schedule CT sinus am of office visit in   2 weeks

## 2014-03-18 NOTE — Progress Notes (Signed)
Subjective:    Patient ID: Jody Brooks, female    DOB: [birth year]   MRN: [id number]  HPI  53 yo [place] female arrived [place] 1985 with no memory of any problem in [place] as child or adult in [place] with any respiratory complaint referred by Drucilla Chalet to pulmonary clinic 03/18/14  for cough with abn ct showing bronchiectasis    03/18/2014 1st [facility] office visit/ Mercia Dowe   Chief Complaint  Patient presents with  . Pulmonary Consult    Referred per Dr. Carlis Abbott for eval of abnormal ct chest. Pt c/o cough x 6 wks- occ prod with minimal clear "light blue" sputum.   6 weeks prior to OV  acute onset cough with pain L lower ant chest rx onset  10/181/5 eval by Delman Cheadle with  W/u pos d dimer > to ER 03/07/14 with ct neg pe but pos bronchiectasis rx zpak only a little better so referred here  Cough is worse in ams never bloodied but sputum described as "dark blue"  No obvious   day to day or daytime variabilty or assoc sob  or cp or chest tightness, subjective wheeze overt sinus or hb symptoms. No unusual exp hx or h/o childhood pna/ asthma or knowledge of premature birth.  Sleeping ok without nocturnal  or early am exacerbation  of respiratory  c/o's or need for noct saba. Also denies any obvious fluctuation of symptoms with weather or environmental changes or other aggravating or alleviating factors except as outlined above   Current Medications, Allergies, Complete Past Medical History, Past Surgical History, Family History, and Social History were reviewed in [employer] record.              Review of Systems  Constitutional: Negative for fever, chills and unexpected weight change.  HENT: Negative for congestion, dental problem, ear pain, nosebleeds, postnasal drip, rhinorrhea, sinus pressure, sneezing, sore throat, trouble swallowing and voice change.   Eyes: Negative for visual disturbance.  Respiratory: Positive for cough. Negative for choking and  shortness of breath.   Cardiovascular: Negative for chest pain and leg swelling.  Gastrointestinal: Negative for vomiting, abdominal pain and diarrhea.  Genitourinary: Negative for difficulty urinating.  Musculoskeletal: Negative for arthralgias.  Skin: Negative for rash.  Neurological: Negative for tremors, syncope and headaches.  Hematological: Does not bruise/bleed easily.       Objective:   Physical Exam  amb laotian female nad  Wt Readings from Last 3 Encounters:  03/18/14 146 lb 12.8 oz (66.588 kg)  03/11/14 146 lb (66.225 kg)  03/07/14 143 lb 3.2 oz (64.955 kg)    Vital signs reviewed   HEENT: nl dentition, turbinates, and orophanx. Nl external ear canals without cough reflex   NECK :  without JVD/Nodes/TM/ nl carotid upstrokes bilaterally   LUNGS: no acc muscle use, clear to A and P bilaterally without cough on insp or exp maneuvers   CV:  RRR  no s3 or murmur or increase in P2, no edema   ABD:  soft and nontender with nl excursion in the supine position. No bruits or organomegaly, bowel sounds nl  MS:  warm without deformities, calf tenderness, cyanosis or clubbing  SKIN: warm and dry without lesions    NEURO:  alert, approp, no deficits    CTa  03/07/14  1. No evidence of acute pulmonary embolism. 2. 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  Lab Results  Component Value Date  WBC 4.8 03/07/2014   HGB 13.5 03/07/2014   HCT 41.8 03/07/2014   MCV 82.0 03/07/2014   PLT 247 03/07/2014      Chemistry      Component Value Date/Time   NA 140 03/07/2014 2142   K 3.9 03/07/2014 2142   CL 101 03/07/2014 2142   CO2 26 03/07/2014 2142   BUN 15 03/07/2014 2142   CREATININE 1.01 03/07/2014 2142   CREATININE 0.89 03/07/2014 1513      Component Value Date/Time   CALCIUM 9.5 03/07/2014 2142   ALKPHOS 57 03/07/2014 2142   AST 24 03/07/2014 2142   ALT 14 03/07/2014 2142   BILITOT 0.3 03/07/2014 2142           Assessment & Plan:

## 2014-03-19 NOTE — Assessment & Plan Note (Addendum)
Obviously this is longstanding with acute flare that did not respond to zpak ? Assoc sinus dz  rec augmentin x 10 day and then sinus CT then return here to complete the w/u  Discussed etiology and natural hx of bronchiectasis and some of the usual complications and how then are managed  For now will hold on further w/u but ideally needs alpha one AT screening and Ig profile as well as prevnar rx in 6 m but given pneumovax today  See instructions for specific recommendations which were reviewed directly with the patient who was given a copy with highlighter outlining the key components.

## 2014-03-19 NOTE — Telephone Encounter (Signed)
LM for patient to call back to clinic.  US guided FNA for thyroid nodule has been ordered.  [name], MS, PA-C   5:43 PM, 03/19/2014

## 2014-03-20 ENCOUNTER — Telehealth: Payer: Self-pay

## 2014-03-20 NOTE — Telephone Encounter (Signed)
Patient wants Jody Brooks to call her on Friday between 9:00am and 10:30am. Please advise. CB # [phone]

## 2014-03-24 NOTE — Telephone Encounter (Signed)
Jody Brooks,  I spoke with this patient with regard to an FNA of her thyroid nodule.  No one has called her despite an order in the system.  Would you please help me find out what is going on?

## 2014-03-24 NOTE — Telephone Encounter (Signed)
Thank you for helping me with this Pamala Hurry.  The patient is aware that we will be calling with the referral.  [name], MS, PA-C   11:49 AM, 03/24/2014

## 2014-03-24 NOTE — Telephone Encounter (Signed)
Checked Referral notes and spoke w/Lily. Referral had been sent and Tammy from [facility] is supposed to call pt to sch once they review. Lily offered to check w/Tammy today to check on status and when pt will be scheduled.

## 2014-04-01 ENCOUNTER — Ambulatory Visit (INDEPENDENT_AMBULATORY_CARE_PROVIDER_SITE_OTHER)
Admission: RE | Admit: 2014-04-01 | Discharge: 2014-04-01 | Disposition: A | Discharge status: Home / Self Care | Payer: BC Managed Care – PPO | Source: Ambulatory Visit | Attending: Internal Medicine | Admitting: Internal Medicine

## 2014-04-01 ENCOUNTER — Other Ambulatory Visit: Payer: BC Managed Care – PPO

## 2014-04-01 DIAGNOSIS — J479 Bronchiectasis, uncomplicated: Secondary | ICD-10-CM

## 2014-04-01 NOTE — Progress Notes (Signed)
Quick Note:  LMTCB ______ 

## 2014-04-03 ENCOUNTER — Other Ambulatory Visit (HOSPITAL_COMMUNITY)
Admission: RE | Admit: 2014-04-03 | Discharge: 2014-04-03 | Disposition: A | Discharge status: Home / Self Care | Payer: BC Managed Care – PPO | Source: Ambulatory Visit | Attending: Interventional Radiology | Admitting: Interventional Radiology

## 2014-04-03 ENCOUNTER — Ambulatory Visit
Admission: RE | Admit: 2014-04-03 | Discharge: 2014-04-03 | Disposition: A | Discharge status: Home / Self Care | Payer: BC Managed Care – PPO | Source: Ambulatory Visit | Attending: Physician Assistant | Admitting: Physician Assistant

## 2014-04-03 DIAGNOSIS — E041 Nontoxic single thyroid nodule: Secondary | ICD-10-CM | POA: Diagnosis not present

## 2014-04-04 ENCOUNTER — Other Ambulatory Visit: Payer: Self-pay | Admitting: Internal Medicine

## 2014-04-04 DIAGNOSIS — R220 Localized swelling, mass and lump, head: Principal | ICD-10-CM

## 2014-04-04 DIAGNOSIS — J3489 Other specified disorders of nose and nasal sinuses: Secondary | ICD-10-CM

## 2014-04-04 NOTE — Progress Notes (Signed)
Quick Note:  Spoke with pt and notified of results per Dr. Melvyn Novas. Pt verbalized understanding and denied any questions. Referral was placed ______

## 2014-04-05 ENCOUNTER — Telehealth: Payer: Self-pay | Admitting: Physician Assistant

## 2014-04-05 NOTE — Telephone Encounter (Signed)
Spoke with patient regarding negative thyroid biopsy.  She reports her cough is better.  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  She was referred to ENT for this, and has received an initial phone call from them but has not received an appointment time yet.  Asked that if she has not received an appointment in two days then to call me back at [facility] and I will investigate. Pulmonary is recommending further testing and vaccination at this time.  Patient received a pneumovax during her last visit, and she should receive the Prevnar at her next follow up, along with Alpha 1 at, and an IGG titer (I don't know which).  I will investigate this further once she is finished with ENT.  Appreciate the recommendations of our specialists.  [name], MS, PA-C   10:10 AM, 04/05/2014

## 2014-05-28 ENCOUNTER — Other Ambulatory Visit: Payer: Self-pay | Admitting: Otolaryngology

## 2014-10-07 ENCOUNTER — Encounter: Payer: Self-pay | Admitting: *Deleted

## 2014-11-21 ENCOUNTER — Encounter: Payer: Self-pay | Admitting: *Deleted

## 2014-12-24 ENCOUNTER — Encounter: Payer: Self-pay | Admitting: Cardiology

## 2015-01-25 ENCOUNTER — Ambulatory Visit (INDEPENDENT_AMBULATORY_CARE_PROVIDER_SITE_OTHER): Payer: BLUE CROSS/BLUE SHIELD

## 2015-01-25 ENCOUNTER — Ambulatory Visit (INDEPENDENT_AMBULATORY_CARE_PROVIDER_SITE_OTHER): Payer: BLUE CROSS/BLUE SHIELD | Admitting: Internal Medicine

## 2015-01-25 VITALS — BP 130/80 | HR 68 | Temp 98.6°F | Resp 16 | Ht 61.0 in | Wt 145.0 lb

## 2015-01-25 DIAGNOSIS — Z7189 Other specified counseling: Secondary | ICD-10-CM

## 2015-01-25 DIAGNOSIS — M25562 Pain in left knee: Secondary | ICD-10-CM | POA: Diagnosis not present

## 2015-01-25 DIAGNOSIS — S86912A Strain of unspecified muscle(s) and tendon(s) at lower leg level, left leg, initial encounter: Secondary | ICD-10-CM

## 2015-01-25 MED ORDER — METHOCARBAMOL 750 MG PO TABS: 750.0000 mg | ORAL_TABLET | Freq: Four times a day (QID) | ORAL | Status: DC

## 2015-01-25 MED ORDER — ALBUTEROL SULFATE HFA 108 (90 BASE) MCG/ACT IN AERS: 1.0000 | INHALATION_SPRAY | Freq: Four times a day (QID) | RESPIRATORY_TRACT | Status: DC | PRN

## 2015-01-25 MED ORDER — IBUPROFEN 600 MG PO TABS: 600.0000 mg | ORAL_TABLET | Freq: Three times a day (TID) | ORAL | Status: DC | PRN

## 2015-01-25 NOTE — Progress Notes (Signed)
Patient ID: Jody Brooks, female   DOB: 01/03/1961, 54 y.o.   MRN: [id number]   01/25/2015 at 3:31 PM  Jody Brooks / DOB: April 10, 1961 / MRN: [id number]  Problem list reviewed and updated by me where necessary.   SUBJECTIVE  Jody Brooks is a 54 y.o. well appearing female presenting for the chief complaint of left lower leg pain for 1 week. Felt pain walking into grocery store on lateral anterior mid shin. She states no swelling or discoloration seen. She has leg wrapped in coban and covered with neoprene sleeve..  Pain came on suddenly. She also requested refill of albuterol inhaler.   She  has a past medical history of Chest pain and Palpitations.    Medications reviewed and updated by myself where necessary, and exist elsewhere in the encounter.   Jody Brooks has No Known Allergies. She  reports that she has never smoked. She has never used smokeless tobacco. She reports that she does not drink alcohol or use illicit drugs. She  has no sexual activity history on file. The patient  has past surgical history that includes Abdominal hysterectomy (2005) and Tubal ligation.  Her family history includes Asthma in her daughter; Cancer in her brother.  Review of Systems  Constitutional: Negative for fever.  Respiratory: Negative for shortness of breath and wheezing.   Cardiovascular: Negative for chest pain.  Gastrointestinal: Negative for nausea.  Musculoskeletal: Positive for myalgias.  Skin: Negative for rash.  Neurological: Negative for dizziness, focal weakness and headaches.    OBJECTIVE  Her  height is 5\' 1"  (1.549 m) and weight is 145 lb (65.772 kg). Her oral temperature is 98.6 F (37 C). Her blood pressure is 130/80 and her pulse is 68. Her respiration is 16 and oxygen saturation is 97%.  The patient's body mass index is 27.41 kg/(m^2).  Physical Exam  Constitutional: She is oriented to person, place, and time. She appears well-developed and  well-nourished.  HENT:  Head: Normocephalic.  Eyes: Conjunctivae are normal. No scleral icterus.  Neck: Normal range of motion.  Cardiovascular: Normal rate, regular rhythm and normal heart sounds.   Respiratory: Effort normal and breath sounds normal.  GI: She exhibits no distension.  Musculoskeletal: Normal range of motion.       Left lower leg: She exhibits tenderness and bony tenderness. She exhibits no swelling, no edema, no deformity and no laceration.       Legs: Neurological: She is alert and oriented to person, place, and time. She has normal strength. No sensory deficit. Gait abnormal. Coordination normal.  Skin: Skin is warm and dry. No bruising, no ecchymosis, no lesion and no rash noted.  Psychiatric: She has a normal mood and affect.  [facility] reading (PRIMARY) by  Dr.Guest no fx, normal xr    No results found for this or any previous visit (from the past 24 hour(s)).  ASSESSMENT & PLAN  Jody Brooks was seen today for leg pain, shortness of breath and other.  Diagnoses and all orders for this visit:  Pain in joint, lower leg, left -     DG Tibia/Fibula Left; Future  Muscle strain, lower leg, left, initial encounter

## 2015-01-25 NOTE — Patient Instructions (Signed)
Medial Head Gastrocnemius Tear (Tennis Leg), with Rehab Medial head gastrocnemius tear, also called tennis leg, is a tear (strain) in a muscle or tendon of the inner portion (medial head) of one of the calf muscles (gastrocnemius). The inner portion of the calf muscle attaches to the thigh bone (femur) and is responsible for bending the knee and straightening the foot (standing "on tiptoe"). Strains are classified into three categories. Grade 1 strains cause pain, but the tendon is not lengthened. Grade 2 strains include a lengthened ligament, due to the ligament being stretched or partially ruptured. With grade 2 strains there is still function, although function may be decreased. Grade 3 strains involve a complete tear of the tendon or muscle, and function is usually impaired. SYMPTOMS   Sudden "pop" or tear felt at the time of injury.  Pain, tenderness, swelling, warmth, or redness over the middle inner calf.  Pain and weakness with ankle motion, especially flexing the ankle against resistance, as well as pain with lifting up the foot (extending the ankle).  Bruising (contusion) of the calf, heel, and sometimes the foot within 48 hours of injury.  Muscle spasm in the calf. CAUSES  Muscle and ligament strains occur when a force is placed on the muscle or ligament that is greater than it can handle. Common causes of injury include:  Direct hit (trauma) to the calf.  Sudden forceful pushing off or landing on the foot (jumping, landing, serving a tennis ball, lunging). RISK INCREASES WITH:  Sports that require sudden, explosive calf muscle contraction, such as those involving jumping (basketball), hill running, quick starts (running), or lunging (racquetball, tennis).  Contact sports (football, soccer, hockey).  Poor strength and flexibility.  Previous lower limb injury. PREVENTION  Warm up and stretch properly before activity.  Allow for adequate recovery between workouts.  Maintain  physical fitness:  Strength, flexibility, and endurance.  Cardiovascular fitness.  Learn and use proper exercise technique.  Complete rehabilitation after lower limb injury, before returning to competition or practice. PROGNOSIS  If treated properly, tennis leg usually heals within 6 weeks of nonsurgical treatment.  RELATED COMPLICATIONS   Longer healing time, if not properly treated or if not given enough time to heal.  Recurring symptoms and injury, if activity is resumed too soon, with overuse, with a direct blow, or with poor technique.  If untreated, may progress to a complete tear (rare) or other injury, due to limping and favoring of the injured leg.  Persistent limping, due to scarring and shortening of the calf muscles, as a result of inadequate rehabilitation.  Prolonged disability. TREATMENT  Treatment first involves the use of ice and medication to help reduce pain and inflammation. The use of strengthening and stretching exercises may help reduce pain with activity. These exercises may be performed at home or with a therapist. For severe injuries, referral to a therapist may be needed for further evaluation and treatment. Your caregiver may advise that you wear a brace to help healing. Sometimes, crutches are needed until you can walk without limping. Rarely, surgery is needed.  MEDICATION   If pain medicine is needed, nonsteroidal anti-inflammatory medicines (aspirin and ibuprofen), or other minor pain relievers (acetaminophen), are often advised.  Do not take pain medicine for 7 days before surgery.  Prescription pain relievers may be given, if your caregiver thinks they are needed. Use only as directed and only as much as you need. HEAT AND COLD  Cold treatment (icing) should be applied for 10 to 15   minutes every 2 to 3 hours for inflammation and pain, and immediately after activity that aggravates your symptoms. Use ice packs or an ice massage.  Heat treatment may  be used before performing stretching and strengthening activities prescribed by your caregiver, physical therapist, or athletic trainer. Use a heat pack or a warm water soak. SEEK MEDICAL CARE IF:   Symptoms get worse or do not improve in 2 weeks, despite treatment.  Numbness or tingling develops.  New, unexplained symptoms develop. (Drugs used in treatment may produce side effects.) EXERCISES  RANGE OF MOTION (ROM) AND STRETCHING EXERCISES - Medial Head Gastrocnemius Tear (Tennis Leg) These exercises may help you when beginning to rehabilitate your injury. Your symptoms may resolve with or without further involvement from your physician, physical therapist, or athletic trainer. While completing these exercises, remember:   Restoring tissue flexibility helps normal motion to return to the joints. This allows healthier, less painful movement and activity.  An effective stretch should be held for at least 30 seconds.  A stretch should never be painful. You should only feel a gentle lengthening or release in the stretched tissue. STRETCH - Gastrocsoleus  Sit with your right / left leg extended. Holding onto both ends of a belt or towel, loop it around the ball of your foot.  Keeping your right / left ankle and foot relaxed and your knee straight, pull your foot and ankle toward you using the belt.  You should feel a gentle stretch behind your calf or knee. Hold this position for __________ seconds. Repeat __________ times. Complete this stretch __________ times per day.  RANGE OF MOTION - Ankle Dorsiflexion, Active Assisted   Remove your shoes and sit on a chair, preferably not on a carpeted surface.  Place your right / left foot directly under the knee. Extend your opposite leg for support.  Keeping your heel down, slide your right / left foot back toward the chair, until you feel a stretch at your ankle or calf. If you do not feel a stretch, slide your bottom forward to the edge of the  chair, while still keeping your heel down.  Hold this stretch for __________ seconds. Repeat __________ times. Complete this stretch __________ times per day.  STRETCH - Gastroc, Standing   Place your hands on a wall.  Extend your right / left leg behind you, keeping the front knee somewhat bent.  Slightly point your toes inward on your back foot.  Keeping your right / left heel on the floor and your knee straight, shift your weight toward the wall, not allowing your back to arch.  You should feel a gentle stretch in the right / left calf. Hold this position for __________ seconds. Repeat __________ times. Complete this stretch __________ times per day. STRETCH - Soleus, Standing   Place your hands on a wall.  Extend your right / left leg behind you, keeping the other knee somewhat bent.  Point your toes of your back foot slightly inward.  Keep your right / left heel on the floor, bend your back knee, and slightly shift your weight over the back leg so that you feel a gentle stretch deep in your back calf.  Hold this position for __________ seconds. Repeat __________ times. Complete this stretch __________ times per day. STRETCH - Gastrocsoleus, Standing Note: This exercise can place a lot of stress on your foot and ankle. Please complete this exercise only if specifically instructed by your caregiver.   Place the ball of   your right / left foot on a step, keeping your other foot firmly on the same step.  Hold on to the wall or a rail for balance.  Slowly lift your other foot, allowing your body weight to press your heel down over the edge of the step.  You should feel a stretch in your right / left calf.  Hold this position for __________ seconds.  Repeat this exercise with a slight bend in your right / left knee. Repeat __________ times. Complete this stretch __________ times per day.  STRENGTHENING EXERCISES - Medial Head Gastrocnemius Tear (Tennis Leg) These exercises  may help you when beginning to rehabilitate your injury. They may resolve your symptoms with or without further involvement from your physician, physical therapist, or athletic trainer. While completing these exercises, remember:   Muscles can gain both the endurance and the strength needed for everyday activities through controlled exercises.  Complete these exercises as instructed by your physician, physical therapist, or athletic trainer. Increase the resistance and repetitions only as guided by your caregiver. STRENGTH - Plantar-flexors  Sit with your right / left leg extended. Holding onto both ends of a rubber exercise band or tubing, loop it around the ball of your foot. Keep a slight tension in the band.  Slowly push your toes away from you, pointing them downward.  Hold this position for __________ seconds. Return slowly, controlling the tension in the band. Repeat __________ times. Complete this exercise __________ times per day.  STRENGTH - Plantar-flexors  Stand with your feet shoulder width apart. Steady yourself with a wall or table, using as little support as needed.  Keeping your weight evenly spread over the width of your feet, rise up on your toes.*  Hold this position for __________ seconds. Repeat __________ times. Complete this exercise __________ times per day.  *If this is too easy, shift your weight toward your right / left leg until you feel challenged. Ultimately, you may be asked to do this exercise while standing on your right / left foot only. STRENGTH - Plantar-flexors, Eccentric Note: This exercise can place a lot of stress on your foot and ankle. Please complete this exercise only if specifically instructed by your caregiver.   Place the balls of your feet on a step. With your hands, use only enough support from a wall or rail to keep your balance.  Keep your knees straight and rise up on your toes.  Slowly shift your weight entirely to your right / left  toes and pick up your opposite foot. Gently and with controlled movement, lower your weight through your right / left foot so that your heel drops below the level of the step. You will feel a slight stretch in the back of your right / left calf.  Use the healthy leg to help rise up onto the balls of both feet, then lower weight only onto the right / left leg again. Build up to 15 repetitions. Then progress to 3 sets of 15 repetitions.*  After completing the above exercise, complete the same exercise with a slight knee bend (about 30 degrees). Again, build up to 15 repetitions. Then progress to 3 sets of 15 repetitions.* Perform this exercise __________ times per day.  *When you easily complete 3 sets of 15, your physician, physical therapist, or athletic trainer may advise you to add resistance, by wearing a backpack filled with additional weight. Document Released: 04/18/2005 Document Revised: 09/02/2013 Document Reviewed: 07/31/2008 [employer] Patient Information 2015 [employer]. This   information is not intended to replace advice given to you by your health care provider. Make sure you discuss any questions you have with your health care provider. Muscle Strain A muscle strain is an injury that occurs when a muscle is stretched beyond its normal length. Usually a small number of muscle fibers are torn when this happens. Muscle strain is rated in degrees. First-degree strains have the least amount of muscle fiber tearing and pain. Second-degree and third-degree strains have increasingly more tearing and pain.  Usually, recovery from muscle strain takes 1-2 weeks. Complete healing takes 5-6 weeks.  CAUSES  Muscle strain happens when a sudden, violent force placed on a muscle stretches it too far. This may occur with lifting, sports, or a fall.  RISK FACTORS Muscle strain is especially common in athletes.  SIGNS AND SYMPTOMS At the site of the muscle strain, there may  be:  Pain.  Bruising.  Swelling.  Difficulty using the muscle due to pain or lack of normal function. DIAGNOSIS  Your health care provider will perform a physical exam and ask about your medical history. TREATMENT  Often, the best treatment for a muscle strain is resting, icing, and applying cold compresses to the injured area.  HOME CARE INSTRUCTIONS   Use the PRICE method of treatment to promote muscle healing during the first 2-3 days after your injury. The PRICE method involves:  Protecting the muscle from being injured again.  Restricting your activity and resting the injured body part.  Icing your injury. To do this, put ice in a plastic bag. Place a towel between your skin and the bag. Then, apply the ice and leave it on from 15-20 minutes each hour. After the third day, switch to moist heat packs.  Apply compression to the injured area with a splint or elastic bandage. Be careful not to wrap it too tightly. This may interfere with blood circulation or increase swelling.  Elevate the injured body part above the level of your heart as often as you can.  Only take over-the-counter or prescription medicines for pain, discomfort, or fever as directed by your health care provider.  Warming up prior to exercise helps to prevent future muscle strains. SEEK MEDICAL CARE IF:   You have increasing pain or swelling in the injured area.  You have numbness, tingling, or a significant loss of strength in the injured area. MAKE SURE YOU:   Understand these instructions.  Will watch your condition.  Will get help right away if you are not doing well or get worse. Document Released: 04/18/2005 Document Revised: 02/06/2013 Document Reviewed: 11/15/2012 [facility] Patient Information 2015 [place], [place]. This information is not intended to replace advice given to you by your health care provider. Make sure you discuss any questions you have with your health care provider.

## 2015-03-21 ENCOUNTER — Ambulatory Visit (INDEPENDENT_AMBULATORY_CARE_PROVIDER_SITE_OTHER): Payer: BLUE CROSS/BLUE SHIELD | Admitting: Internal Medicine

## 2015-03-21 ENCOUNTER — Ambulatory Visit (INDEPENDENT_AMBULATORY_CARE_PROVIDER_SITE_OTHER): Payer: BLUE CROSS/BLUE SHIELD

## 2015-03-21 VITALS — BP 118/80 | HR 84 | Temp 99.1°F | Resp 16 | Ht 61.5 in | Wt 144.5 lb

## 2015-03-21 DIAGNOSIS — M25461 Effusion, right knee: Secondary | ICD-10-CM

## 2015-03-21 MED ORDER — MELOXICAM 15 MG PO TABS: 15.0000 mg | ORAL_TABLET | Freq: Every day | ORAL | Status: DC

## 2015-03-21 NOTE — Patient Instructions (Signed)
Get a knee brace for work called a patellar sleeve(with opening to fit over the kneecap. Recheck 3-4 weeks if not well

## 2015-03-21 NOTE — Progress Notes (Signed)
   Subjective:    Patient ID: Jody Brooks, female    DOB: 1960-05-19, 54 y.o.   MRN: [id number] This chart was scribed for Jody Lin, MD by Marti Sleigh, Medical Scribe. This patient was seen in Room 8 and the patient's care was started a 11:04 AM.  Chief Complaint  Patient presents with  . Knee Pain    with swelling x 1 week    HPI HPI Comments: Shaquetta Keast is a 54 y.o. female who presents to [facility] complaining of right knee swelling and pain for the last week. She has more pain when laying down. She has had some left knee weakness in the past. She denies MOI or falling.    Review of Systems  Constitutional: Negative for fever and chills.  Musculoskeletal: Positive for joint swelling. Negative for gait problem.  Skin: Negative for color change and wound.  Neurological: Positive for weakness.       Objective:  BP 118/80 mmHg  Pulse 84  Temp(Src) 99.1 F (37.3 C) (Oral)  Resp 16  Ht 5' 1.5" (1.562 m)  Wt 144 lb 8 oz (65.545 kg)  BMI 26.86 kg/m2  SpO2 98%  Physical Exam  Constitutional: She is oriented to person, place, and time. She appears well-developed and well-nourished. No distress.  HENT:  Head: Normocephalic and atraumatic.  Eyes: Pupils are equal, round, and reactive to light.  Neck: Neck supple.  Cardiovascular: Normal rate.   Pulmonary/Chest: Effort normal. No respiratory distress.  Musculoskeletal: Normal range of motion.  Right knee is swollen in the super patellar area. Tender on the medial joint line and in the popliteal fossa.   Neurological: She is alert and oriented to person, place, and time. Coordination normal.  Skin: Skin is warm and dry. She is not diaphoretic.  Psychiatric: She has a normal mood and affect. Her behavior is normal.  Nursing note and vitals reviewed.  [facility] reading (PRIMARY) by  Dr. Laney Pastor. Right knee x-ray: normal except patellar spur     Assessment & Plan:  Swelling of knee joint, right - Plan: DG Knee  Complete 4 Views Right  Patellar sleeve melox 15 3=4 weeks Limit walking at work If not resolved to ortho for eval  I have completed the patient encounter in its entirety as documented by the scribe, with editing by me where necessary. Jody Brooks P. Laney Pastor, M.D.  By signing my name below, I, Judithe Modest, attest that this documentation has been prepared under the direction and in the presence of Jody Lin, MD. Electronically Signed: Judithe Modest, ER Scribe. 03/21/2015. 11:05 AM.

## 2015-04-20 ENCOUNTER — Ambulatory Visit (INDEPENDENT_AMBULATORY_CARE_PROVIDER_SITE_OTHER): Payer: BLUE CROSS/BLUE SHIELD | Admitting: Family Medicine

## 2015-04-20 VITALS — BP 122/80 | HR 74 | Temp 98.1°F | Resp 18 | Ht 62.0 in | Wt 146.0 lb

## 2015-04-20 DIAGNOSIS — M25561 Pain in right knee: Secondary | ICD-10-CM | POA: Diagnosis not present

## 2015-04-20 DIAGNOSIS — M25461 Effusion, right knee: Secondary | ICD-10-CM | POA: Diagnosis not present

## 2015-04-20 NOTE — Progress Notes (Signed)
Chief Complaint:  Chief Complaint  Patient presents with  . Knee Pain    both x3 weeks     HPI: Jody Brooks is a 54 y.o. female who reports to [facility] today complaining of approximately 6 week of right knee pain with swelling, she has had xray with some effusion and also knee pain, pain is anterior and diffuse when she flexes her knee.  She has now left knee pain. She has been compensating for right knee pain. She has pain without moving, when she lays down or when she moves fast. Sharp pain and then goes away,. Has tried mobic and knee sleeve without releif.  Does not feel unstable, no weakness numbness or tingling. She works in [employer] and stands 12 hours per day. This makes it worse. She has pain and swelling.  No prior injuries  CLINICAL DATA: Right knee pain and swelling for 1 week.  EXAM: RIGHT KNEE - COMPLETE 4+ VIEW  COMPARISON: None.  FINDINGS: There is no fracture or dislocation. There is a moderate joint effusion. Small osteophyte on the patella. Slight calcification at the quadriceps insertion on the upper pole of the patella.  IMPRESSION: Joint effusion. Minimal degenerative changes.   Electronically Signed  By: Lorriane Shire M.D.  On: 03/21/2015 12:30  Past Medical History  Diagnosis Date  . Chest pain   . Palpitations    Past Surgical History  Procedure Laterality Date  . Abdominal hysterectomy  2005  . Tubal ligation     Social History   Social History  . Marital Status: Married    Spouse Name: N/A  . Number of Children: N/A  . Years of Education: N/A   Social History Main Topics  . Smoking status: Never Smoker   . Smokeless tobacco: Never Used  . Alcohol Use: No  . Drug Use: No  . Sexual Activity: Not Asked   Other Topics Concern  . None   Social History Narrative   Family History  Problem Relation Age of Onset  . Asthma Daughter   . Cancer Brother    No Known Allergies Prior to Admission  medications   Medication Sig Start Date End Date Taking? Authorizing Provider  albuterol (PROVENTIL HFA;VENTOLIN HFA) 108 (90 BASE) MCG/ACT inhaler Inhale 1-2 puffs into the lungs every 6 (six) hours as needed for wheezing or shortness of breath. 01/25/15  Yes Orma Flaming, MD  methocarbamol (ROBAXIN-750) 750 MG tablet Take 1 tablet (750 mg total) by mouth 4 (four) times daily. 01/25/15  Yes Orma Flaming, MD  ibuprofen (ADVIL,MOTRIN) 600 MG tablet Take 1 tablet (600 mg total) by mouth every 8 (eight) hours as needed. Patient not taking: Reported on 04/20/2015 01/25/15   Orma Flaming, MD  meloxicam (MOBIC) 15 MG tablet Take 1 tablet (15 mg total) by mouth daily. Patient not taking: Reported on 04/20/2015 03/21/15   Leandrew Koyanagi, MD     ROS: The patient denies fevers, chills, night sweats, unintentional weight loss, chest pain, palpitations, wheezing, dyspnea on exertion, nausea, vomiting, abdominal pain, dysuria, hematuria, melena, numbness, weakness, or tingling.  All other systems have been reviewed and were otherwise negative with the exception of those mentioned in the HPI and as above.    PHYSICAL EXAM: Filed Vitals:   04/20/15 1427  BP: 122/80  Pulse: 74  Temp: 98.1 F (36.7 C)  Resp: 18   Body mass index is 26.7 kg/(m^2).   General: Alert, no acute distress HEENT:  Normocephalic, atraumatic, oropharynx patent. EOMI, PERRLA Cardiovascular:  Regular rate and rhythm, no rubs murmurs or gallops.  No Carotid bruits, radial pulse intact. No pedal edema.  Respiratory: Clear to auscultation bilaterally.  No wheezes, rales, or rhonchi.  No cyanosis, no use of accessory musculature Abdominal: No organomegaly, abdomen is soft and non-tender, positive bowel sounds. No masses. Skin: No rashes. Neurologic: Facial musculature symmetric. Psychiatric: Patient acts appropriately throughout our interaction. Lymphatic: No cervical or submandibular lymphadenopathy Musculoskeletal: Gait  intact. No deformity, + minimal ballotment Diffuse tenderness Decrease AROM, full PROM 5/5 strength, 2/2 DTRs ankle ,  Neg Lachman, +/- medial jt line tenderness, neg McMurray L spine normal ROM,  Straight leg negative Hip normal ROM     LABS: Results for orders placed or performed during the hospital encounter of 03/07/14  CBC with Differential  Result Value Ref Range   WBC 4.8 4.0 - 10.5 K/uL   RBC 5.10 3.87 - 5.11 MIL/uL   Hemoglobin 13.5 12.0 - 15.0 g/dL   HCT 41.8 36.0 - 46.0 %   MCV 82.0 78.0 - 100.0 fL   MCH 26.5 26.0 - 34.0 pg   MCHC 32.3 30.0 - 36.0 g/dL   RDW 14.9 11.5 - 15.5 %   Platelets 247 150 - 400 K/uL   Neutrophils Relative % 49 43 - 77 %   Neutro Abs 2.3 1.7 - 7.7 K/uL   Lymphocytes Relative 38 12 - 46 %   Lymphs Abs 1.8 0.7 - 4.0 K/uL   Monocytes Relative 11 3 - 12 %   Monocytes Absolute 0.5 0.1 - 1.0 K/uL   Eosinophils Relative 2 0 - 5 %   Eosinophils Absolute 0.1 0.0 - 0.7 K/uL   Basophils Relative 0 0 - 1 %   Basophils Absolute 0.0 0.0 - 0.1 K/uL  Comprehensive metabolic panel  Result Value Ref Range   Sodium 140 137 - 147 mEq/L   Potassium 3.9 3.7 - 5.3 mEq/L   Chloride 101 96 - 112 mEq/L   CO2 26 19 - 32 mEq/L   Glucose, Bld 105 (H) 70 - 99 mg/dL   BUN 15 6 - 23 mg/dL   Creatinine, Ser 1.01 0.50 - 1.10 mg/dL   Calcium 9.5 8.4 - 10.5 mg/dL   Total Protein 8.2 6.0 - 8.3 g/dL   Albumin 3.8 3.5 - 5.2 g/dL   AST 24 0 - 37 U/L   ALT 14 0 - 35 U/L   Alkaline Phosphatase 57 39 - 117 U/L   Total Bilirubin 0.3 0.3 - 1.2 mg/dL   GFR calc non Af Amer 62 (L) >90 mL/min   GFR calc Af Amer 72 (L) >90 mL/min   Anion gap 13 5 - 15  I-stat troponin, ED  Result Value Ref Range   Troponin i, poc 0.00 0.00 - 0.08 ng/mL   Comment 3             EKG/XRAY:   Primary read interpreted by Dr. Marin Comment at [facility].   ASSESSMENT/PLAN: Encounter Diagnoses  Name Primary?  . Swelling of knee joint, right Yes  . Right knee pain    Right knee pain with small effusion  likely arthritic in nature, she probably would benefit from steroid knee  Injection. Since ongoing problem and wants to defer this until she comes back from [place] then I will go ahead and refer her to ortho Cont with knee brace/mobic/robaxin prn , her pain is more medial and since I only inject steroid laterally I  would prefer her to go to ortho Fu prn   Gross sideeffects, risk and benefits, and alternatives of medications d/w patient. Patient is aware that all medications have potential sideeffects and we are unable to predict every sideeffect or drug-drug interaction that may occur.  Shriya Aker DO  04/27/2015 1:45 PM

## 2015-04-24 ENCOUNTER — Telehealth: Payer: Self-pay

## 2015-04-24 NOTE — Telephone Encounter (Signed)
Pt called requesting status on refill. Please check status.

## 2015-04-27 NOTE — Telephone Encounter (Signed)
I meant referral, sorry.

## 2015-05-06 ENCOUNTER — Telehealth: Payer: Self-pay

## 2015-05-06 NOTE — Telephone Encounter (Signed)
Ivin Booty from [facility] is calling because patient is being treated there for w/c and they need any records and x-ray's regarding patient's right knee issues. [id number]

## 2015-05-07 NOTE — Telephone Encounter (Signed)
Sent message to [employer] at [employer].   She will handle.

## 2015-12-05 ENCOUNTER — Ambulatory Visit (INDEPENDENT_AMBULATORY_CARE_PROVIDER_SITE_OTHER): Payer: BLUE CROSS/BLUE SHIELD | Admitting: Osteopathic Medicine

## 2015-12-05 VITALS — BP 126/84 | HR 77 | Temp 98.0°F | Resp 16 | Ht 62.0 in | Wt 155.6 lb

## 2015-12-05 DIAGNOSIS — M778 Other enthesopathies, not elsewhere classified: Secondary | ICD-10-CM

## 2015-12-05 DIAGNOSIS — M779 Enthesopathy, unspecified: Secondary | ICD-10-CM | POA: Diagnosis not present

## 2015-12-05 MED ORDER — NAPROXEN 500 MG PO TABS: 500.0000 mg | ORAL_TABLET | Freq: Two times a day (BID) | ORAL | 0 refills | Status: AC

## 2015-12-05 MED ORDER — METHOCARBAMOL 750 MG PO TABS: 750.0000 mg | ORAL_TABLET | Freq: Three times a day (TID) | ORAL | 0 refills | Status: DC | PRN

## 2015-12-05 NOTE — Patient Instructions (Addendum)
     IF you received an x-ray today, you will receive an invoice from [facility]. Please contact [facility] at [phone] with questions or concerns regarding your invoice.   IF you received labwork today, you will receive an invoice from [employer]. Please contact [facility] at [phone] with questions or concerns regarding your invoice.   Our billing staff will not be able to assist you with questions regarding bills from these companies.  You will be contacted with the lab results as soon as they are available. The fastest way to get your results is to activate your My Chart account. Instructions are located on the last page of this paperwork. If you have not heard from [place] regarding the results in 2 weeks, please contact this office.         Tendinitis  Tendinitis is redness, soreness, and puffiness (inflammation) of the tendons. Tendons are band-like tissues that connect muscle to bone. Tendinitis often happens in the shoulders, heels, or elbows. It might happen if your job involves doing the same motions over and over. HOME CARE  Use a sling or splint as told by your doctor.  Put ice on the injured area.  Put ice in a plastic bag.  Place a towel between your skin and the bag.  Leave the ice on for 15-20 minutes, 03-04 times a day.  Avoid using your injured arm or leg until the pain goes away.  Do gentle exercises only as told by your doctor. Stop exercises if the pain gets worse, unless your doctor tells you otherwise.  Only take medicines as told by your doctor. GET HELP RIGHT AWAY IF:  Your pain and puffiness get worse.  You have new problems, such as loss of feeling (numbness) in the hands. MAKE SURE YOU:  Understand these instructions.  Will watch your condition.  Will get help right away if you are not doing well or get worse.   This information is not intended to replace advice given to you by your health  care provider. Make sure you discuss any questions you have with your health care provider.   Document Released: 07/29/2010 Document Revised: 07/11/2011 Document Reviewed: 07/29/2010 Elsevier Interactive Patient Education [employer].

## 2015-12-05 NOTE — Progress Notes (Signed)
HPI: Jody Brooks is a 55 y.o.  female  who presents to [facility] today, 12/05/15,  for chief complaint of:  Chief Complaint  Patient presents with  . Arm Pain    pain in both arms, x last week, says she has not been lifting anything heavy, has not felt this pain before     . Location: bilateral arm pain worse on R, forearm, lateral/radial pain, no elbow pain, some shoulder discomfort  . Quality: soreness, feels cramping . Duration: 1 week +, on and off few months . Timing: worse with working, she is right handed . Modifying factors: . Assoc signs/symptoms: no numbness in fingers, no headache, no weakness, no swelling  Context: patient is extremely concerned about a blood clot. No hx DVT/PE    Past medical, surgical, social and family history reviewed: Past Medical History:  Diagnosis Date  . Chest pain   . Palpitations    Past Surgical History:  Procedure Laterality Date  . ABDOMINAL HYSTERECTOMY  2005  . TUBAL LIGATION     Social History  Substance Use Topics  . Smoking status: Never Smoker  . Smokeless tobacco: Never Used  . Alcohol use No   Family History  Problem Relation Age of Onset  . Asthma Daughter   . Cancer Brother      Current medication list and allergy/intolerance information reviewed:   Current Outpatient Prescriptions  Medication Sig Dispense Refill  . ASPIRIN PO Take by mouth.    Marland Kitchen albuterol (PROVENTIL HFA;VENTOLIN HFA) 108 (90 BASE) MCG/ACT inhaler Inhale 1-2 puffs into the lungs every 6 (six) hours as needed for wheezing or shortness of breath. (Patient not taking: Reported on 12/05/2015) 1 Inhaler 1  . methocarbamol (ROBAXIN-750) 750 MG tablet Take 1 tablet (750 mg total) by mouth 4 (four) times daily. (Patient not taking: Reported on 12/05/2015) 40 tablet 1   No current facility-administered medications for this visit.    No Known Allergies    Review of Systems:  Constitutional:  No  fever, no  chills  HEENT: No  headache, no vision change, no hearing change, No sore throat, No  sinus pressure  Cardiac: No  chest pain, No  pressure, No palpitations  Respiratory:  No  shortness of breath.  Gastrointestinal: No  abdominal pain  Musculoskeletal: (+) new myalgia/arthralgia  Skin: No  Rash, No other wounds/concerning lesions  Hem/Onc: No  abnormal lymph node  Neurologic: No  weakness, No  dizziness  Exam:  BP 126/84 (BP Location: Right Arm, Patient Position: Sitting, Cuff Size: Normal)   Pulse 77   Temp 98 F (36.7 C) (Oral)   Resp 16   Ht 5\' 2"  (1.575 m)   Wt 155 lb 9.6 oz (70.6 kg)   SpO2 97%   BMI 28.46 kg/m   Constitutional: VS see above. General Appearance: alert, well-developed, well-nourished, NAD  Ears, Nose, Mouth, Throat: MMM,  Neck: No masses, trachea midline.   Respiratory: Normal respiratory effort. no wheeze, no rhonchi, no rales  Cardiovascular: S1/S2 normal, no murmur, no rub/gallop auscultated. RRR. No lower extremity edema. Radial pulse 2/4 bilaterally  Musculoskeletal: Gait normal. No clubbing/cyanosis of digits. Neg Phalen's/Tinel's, neg Spurlings, Empty Can, Drop Arm. Normal active ROM shoulder, elbow, wrist b/l, Mild (+) apprehension test R shoulder. Neg Spurling's bilaterally. (+) discomfort in R extensors on resitsted extension of wrist no epicondylar pain, (-) discomfort on resisted flexion wrist R  Neurological: No cranial nerve deficit on limited exam. Motor and sensation  intact and symmetric both arms. Cerebellar reflexes grossly intact. Normal balance/coordination. No tremor.   Skin: warm, dry, intact. No rash/ulcer      ASSESSMENT/PLAN:   Patient is very concerned about possible blood clot. I reassured her that musculoskeletal issue is far more likely given her symptoms, no surgeries or vascular procedures on the arm, no swelling, no symptoms of compartment syndrome/edema/shortness of breath. VTE is very low on the diagnoses for  her pain, such that I would not worry about further workup at this time Rock Nephew -2). Advised physical therapy, consider sports med referral if no improvement.   Forearm tendonitis - Extensor sheath irritation, no lateral epicondule pain, +/- radial nerve compression issues, no carpal tunnel symptoms.      Visit summary with medication list and pertinent instructions was printed for patient to review. All questions at time of visit were answered - patient instructed to contact office with any additional concerns. ER/RTC precautions were reviewed with the patient. Follow-up plan: No Follow-up on file.  Note: Total time spent 25 minutes, greater than 50% of the visit was spent face-to-face counseling and coordinating care for the following: The encounter diagnosis was Forearm tendonitis.Marland Kitchen

## 2015-12-10 ENCOUNTER — Ambulatory Visit (INDEPENDENT_AMBULATORY_CARE_PROVIDER_SITE_OTHER): Payer: BLUE CROSS/BLUE SHIELD | Admitting: Family Medicine

## 2015-12-10 VITALS — BP 124/78 | HR 73 | Temp 98.5°F | Resp 16 | Ht 62.0 in | Wt 157.0 lb

## 2015-12-10 DIAGNOSIS — R062 Wheezing: Secondary | ICD-10-CM | POA: Diagnosis not present

## 2015-12-10 DIAGNOSIS — Z7189 Other specified counseling: Secondary | ICD-10-CM | POA: Diagnosis not present

## 2015-12-10 DIAGNOSIS — R0600 Dyspnea, unspecified: Secondary | ICD-10-CM | POA: Diagnosis not present

## 2015-12-10 MED ORDER — ALBUTEROL SULFATE HFA 108 (90 BASE) MCG/ACT IN AERS: 1.0000 | INHALATION_SPRAY | Freq: Four times a day (QID) | RESPIRATORY_TRACT | 1 refills | Status: AC | PRN

## 2015-12-10 MED ORDER — PREDNISONE 20 MG PO TABS: 40.0000 mg | ORAL_TABLET | Freq: Every day | ORAL | 0 refills | Status: DC

## 2015-12-10 MED ORDER — ALBUTEROL SULFATE (2.5 MG/3ML) 0.083% IN NEBU
2.5000 mg | INHALATION_SOLUTION | Freq: Once | RESPIRATORY_TRACT | Status: AC
  Administered 2015-12-10: 2.5 mg via RESPIRATORY_TRACT

## 2015-12-10 MED ORDER — ALBUTEROL SULFATE HFA 108 (90 BASE) MCG/ACT IN AERS: 1.0000 | INHALATION_SPRAY | Freq: Four times a day (QID) | RESPIRATORY_TRACT | 1 refills | Status: DC | PRN

## 2015-12-10 NOTE — Addendum Note (Signed)
Addended byMingo Amber, Kayleen Memos on: 12/10/2015 06:11 PM   Modules accepted: Orders

## 2015-12-10 NOTE — Patient Instructions (Addendum)
  It was good to meet you today  I think you are having an asthma exacerbation.   Use the albuterol every 4 - 6 hours as needed.  Use it also before sleep.  Starting Sunday, you can just use this when you need it.  Take the prednisone once a day in the morning for 5 days.  If you're not doing better, come back and see [place].      IF you received an x-ray today, you will receive an invoice from [facility]. Please contact [facility] at [phone] with questions or concerns regarding your invoice.   IF you received labwork today, you will receive an invoice from [employer]. Please contact [facility] at [phone] with questions or concerns regarding your invoice.   Our billing staff will not be able to assist you with questions regarding bills from these companies.  You will be contacted with the lab results as soon as they are available. The fastest way to get your results is to activate your My Chart account. Instructions are located on the last page of this paperwork. If you have not heard from [place] regarding the results in 2 weeks, please contact this office.

## 2015-12-10 NOTE — Progress Notes (Signed)
Jody Brooks is a 55 y.o. female who presents to Urgent Care today for cough and shortness of breath  1.  Cough:  With associated dyspnea.  Present for a week.  Has heard some wheezing when she tries to take a deep breath, difficulty taking deep breaths.  Cough is dry and hacking.  Awakens her from sleep.  No chest pain.  No fevers or chills.  No LE edema or recent long travel.    She had CT of chest in 2015 which revealed bronchiectasis and "chronic lung disease."  She states that she occasionally has wheezing after activities but has never been diagnosed as asthmatic.  Daughter has diagnosis of asthma.    ROS as above.  PMH reviewed. Patient is a nonsmoker.   Past Medical History:  Diagnosis Date  . Chest pain   . Palpitations    Past Surgical History:  Procedure Laterality Date  . ABDOMINAL HYSTERECTOMY  2005  . TUBAL LIGATION      Medications reviewed. Current Outpatient Prescriptions  Medication Sig Dispense Refill  . albuterol (PROVENTIL HFA;VENTOLIN HFA) 108 (90 BASE) MCG/ACT inhaler Inhale 1-2 puffs into the lungs every 6 (six) hours as needed for wheezing or shortness of breath. 1 Inhaler 1  . ASPIRIN PO Take by mouth.    . methocarbamol (ROBAXIN-750) 750 MG tablet Take 1 tablet (750 mg total) by mouth every 8 (eight) hours as needed for muscle spasms. 30 tablet 0  . naproxen (NAPROSYN) 500 MG tablet Take 1 tablet (500 mg total) by mouth 2 (two) times daily with a meal. As needed for arm/muscle pain 30 tablet 0   No current facility-administered medications for this visit.      Physical Exam:  BP 124/78 (BP Location: Right Arm, Patient Position: Sitting, Cuff Size: Normal)   Pulse 73   Temp 98.5 F (36.9 C)   Resp 16   Ht 5\' 2"  (1.575 m)   Wt 157 lb (71.2 kg)   SpO2 96%   BMI 28.72 kg/m  Gen:  Alert, cooperative patient who appears stated age in no acute distress.  Vital signs reviewed. HEENT: EOMI,  MMM Pulm:  Diffuse wheezing in all lung fields.   Cardiac:  Regular rate and rhythm without murmur auscultated.   Exts: Non edematous BL  LE, warm and well perfused.   Assessment and Plan:  1.  Wheezing: - no actual diagnosis of asthma, but family history and she is a nonsmoker.  - treating as exacerbation -- prednisone plus albuterol. - She should return for PFT testing in around 3-4 weeks to further clarify her wheezing.  Explained this to her.

## 2016-07-19 IMAGING — CT CT ANGIO CHEST
1 of 2 series · 19 of 32 positions shown · IV contrast (omnipaque)
Comparison: Radiograph 03/07/2014

CLINICAL DATA: Cough for 4 weeks. Generalized body aches in
weakness. Concern for pulmonary embolism.

EXAM:
CT ANGIOGRAPHY CHEST WITH CONTRAST
TECHNIQUE: Multidetector CT imaging of the chest was performed using the
standard protocol during bolus administration of intravenous
contrast. Multiplanar CT image reconstructions and MIPs were
obtained to evaluate the vascular anatomy.
CONTRAST:  100mL OMNIPAQUE IOHEXOL 350 MG/ML SOLN

[Series 11: thins for pacs · axial · 0.61mm/px · z∈[+1299,+1501]mm · 19 of 226 slices shown]
[im 12/226  lung]
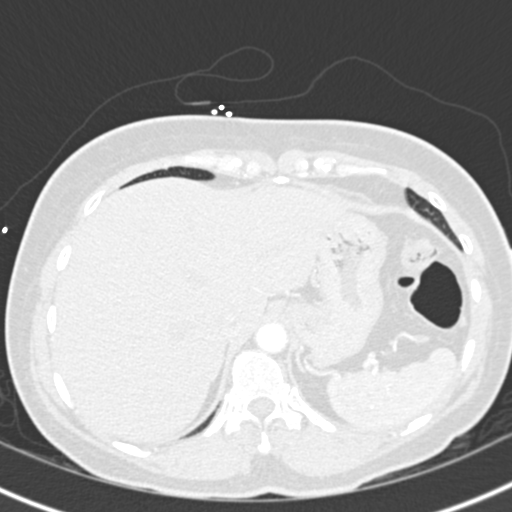
[im 23/226  mediastinal]
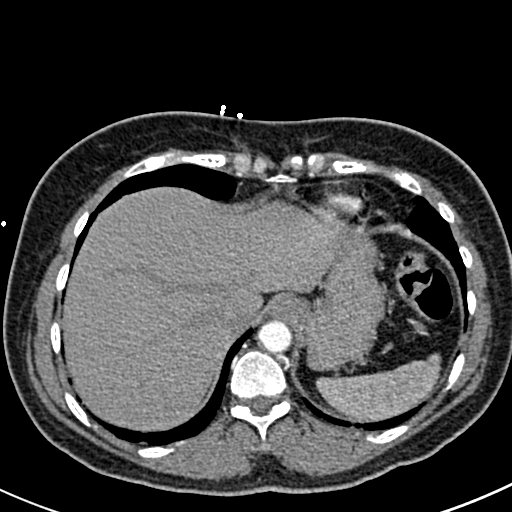
[im 34/226  lung]
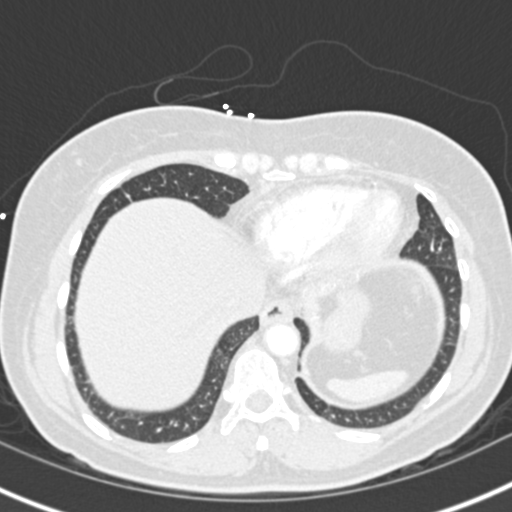
[im 57/226  mediastinal]
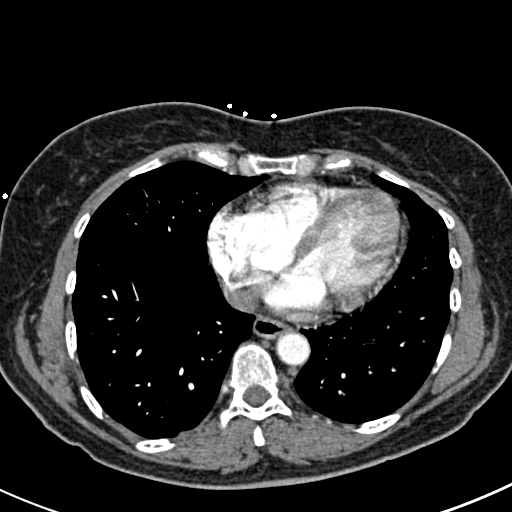
[im 68/226  lung]
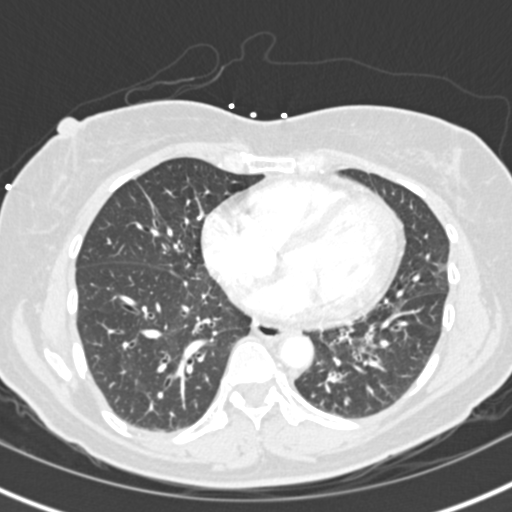
[im 76/226  mediastinal]
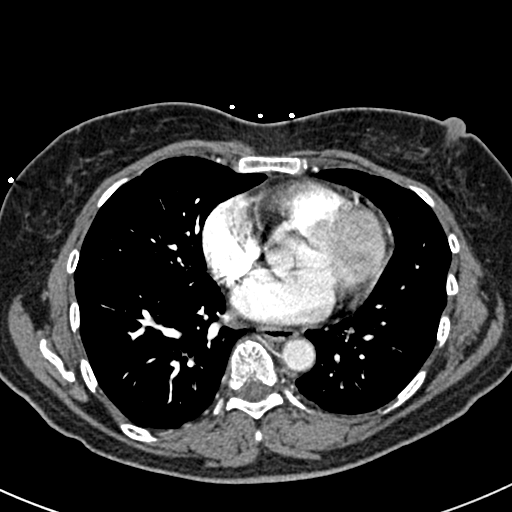
[im 79/226  lung]
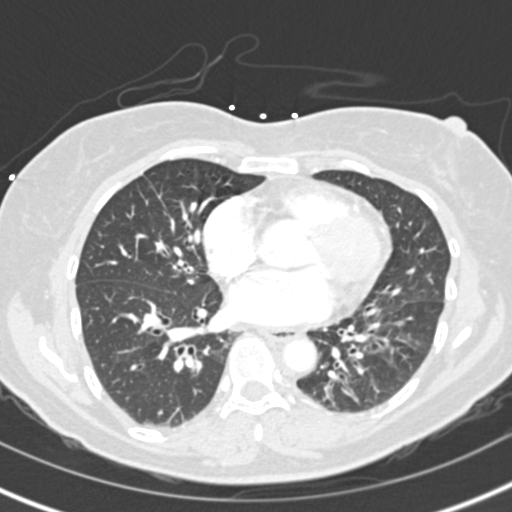
[im 91/226  mediastinal]
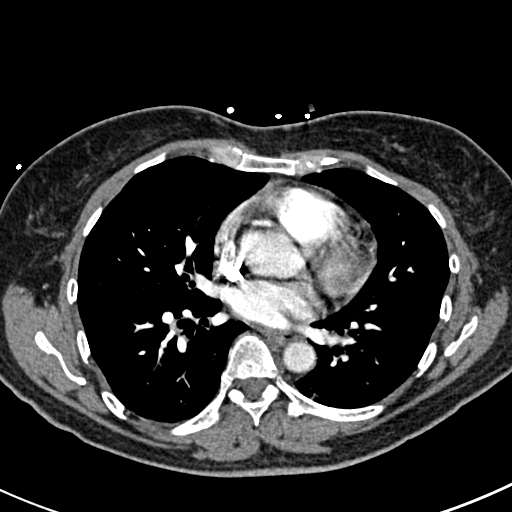
[im 102/226  lung]
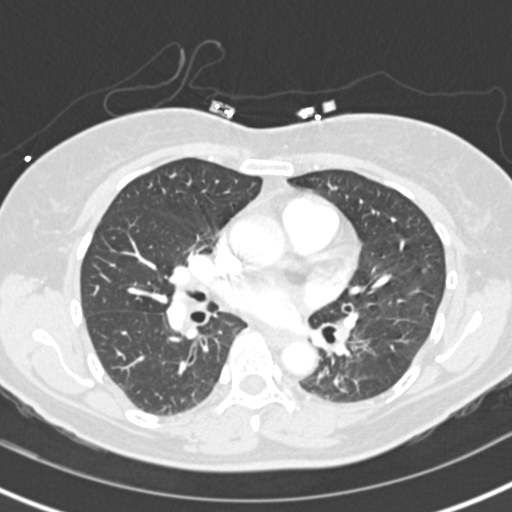
[im 113/226  mediastinal]
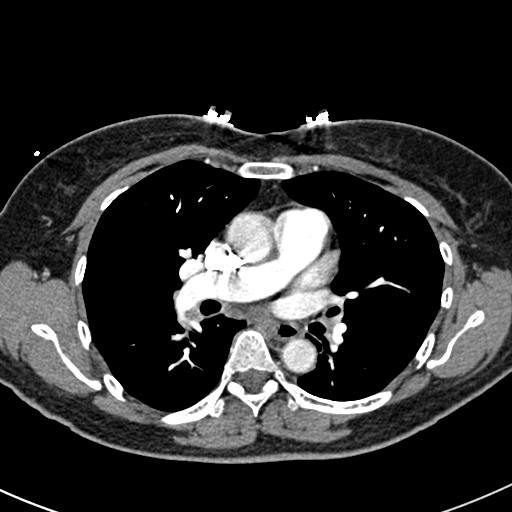
[im 124/226  lung]
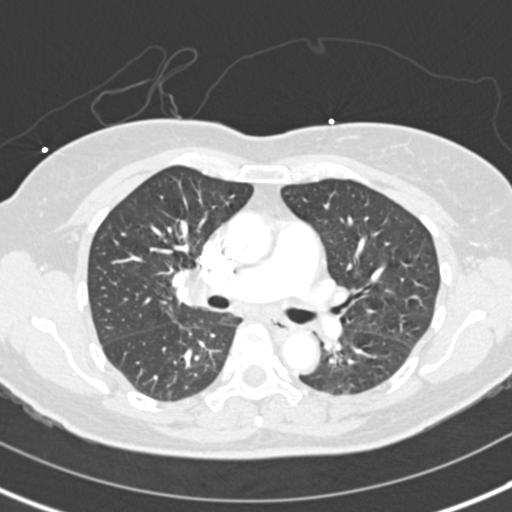
[im 136/226  mediastinal]
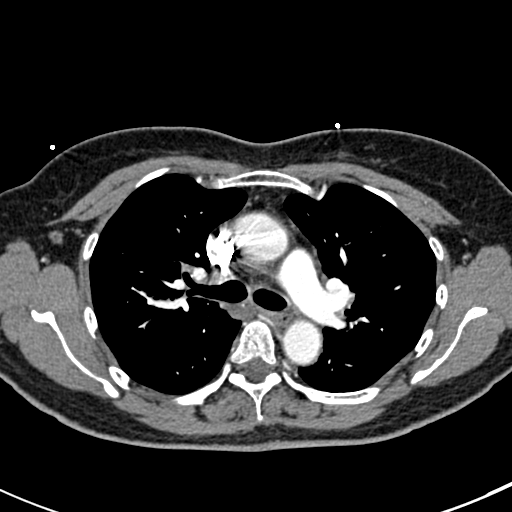
[im 147/226  lung]
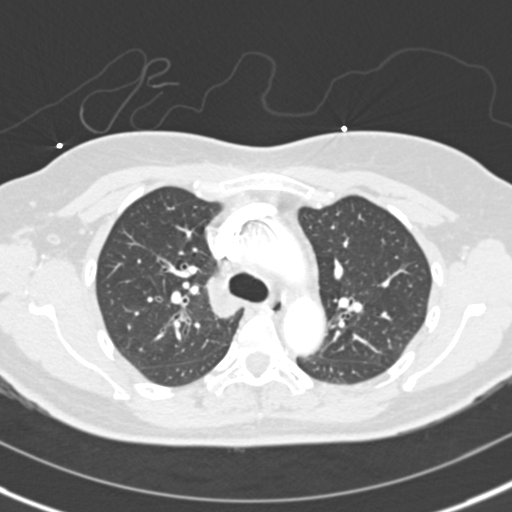
[im 151/226  mediastinal]
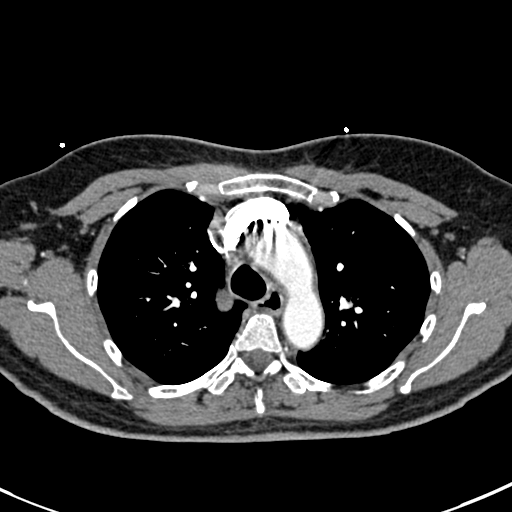
[im 158/226  lung]
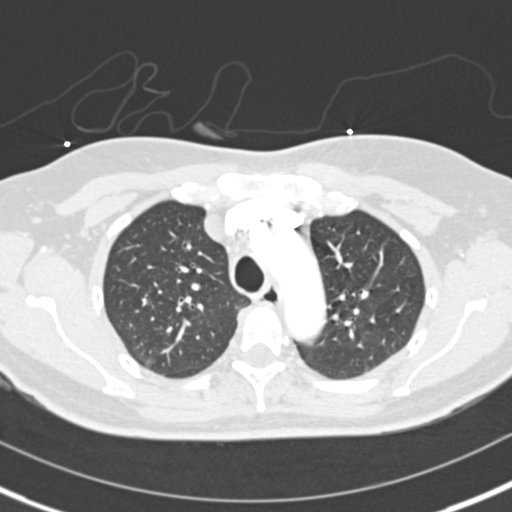
[im 169/226  mediastinal]
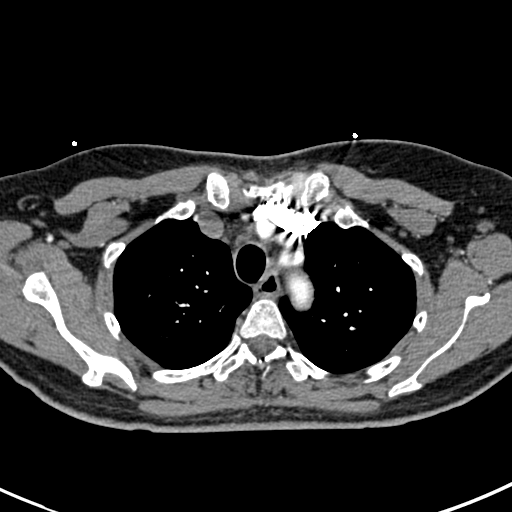
[im 192/226  lung]
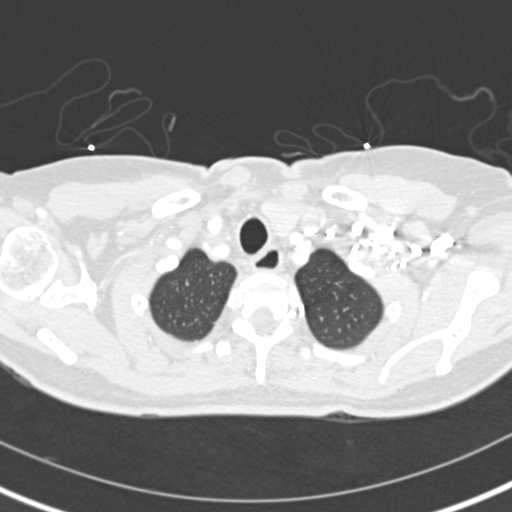
[im 203/226  mediastinal]
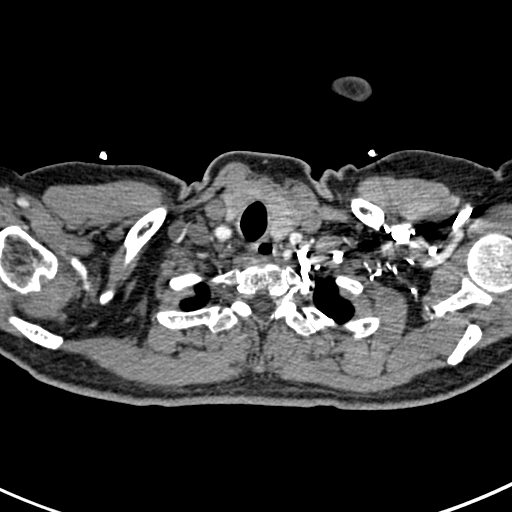
[im 214/226  lung]
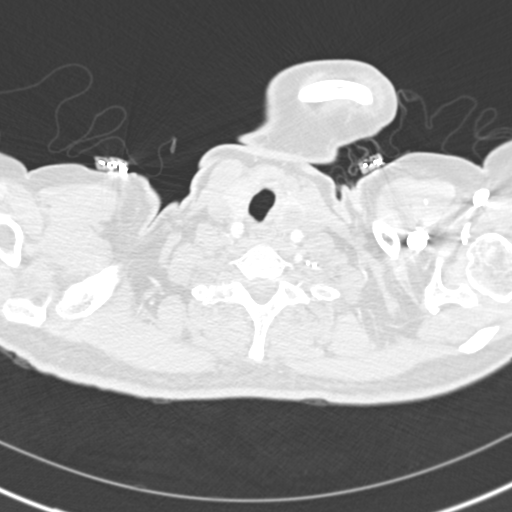

[19 of 32 positions shown; findings below may reference images not displayed]

FINDINGS: No filling defects within the pulmonary arteries to suggest acute
pulmonary embolism. No acute findings aorta great vessels. No
pericardial fluid.

No pneumothorax or pleural fluid. No infarction or infiltrate. Mild
bronchiectasis in the lower lobes.

No axillary or supraclavicular lymphadenopathy. 16 mm nodule in the
left lobe of thyroid gland.

No mediastinal adenopathy. Mild hilar adenopathy. Esophagus is
normal. Limited view of the upper abdomen is unremarkable. Limited
view of the skeleton unremarkable appear

Review of the MIP images confirms the above findings.
IMPRESSION: 1. No evidence of acute pulmonary embolism.
2. Basilar bronchiectasis and mild adenopathy is likely related to
chronic lung disease.
3. Nodule within the left lobe of thyroid gland. Consider thyroid
ultrasound for further characterization.

## 2016-07-19 IMAGING — CR DG CHEST 2V
2 series · 2 of 2 positions shown · non-contrast
Comparison: 02/04/2014.

CLINICAL DATA: Cough.  Patient has a 2 week history of cough.

EXAM:
CHEST  2 VIEW

[PA]
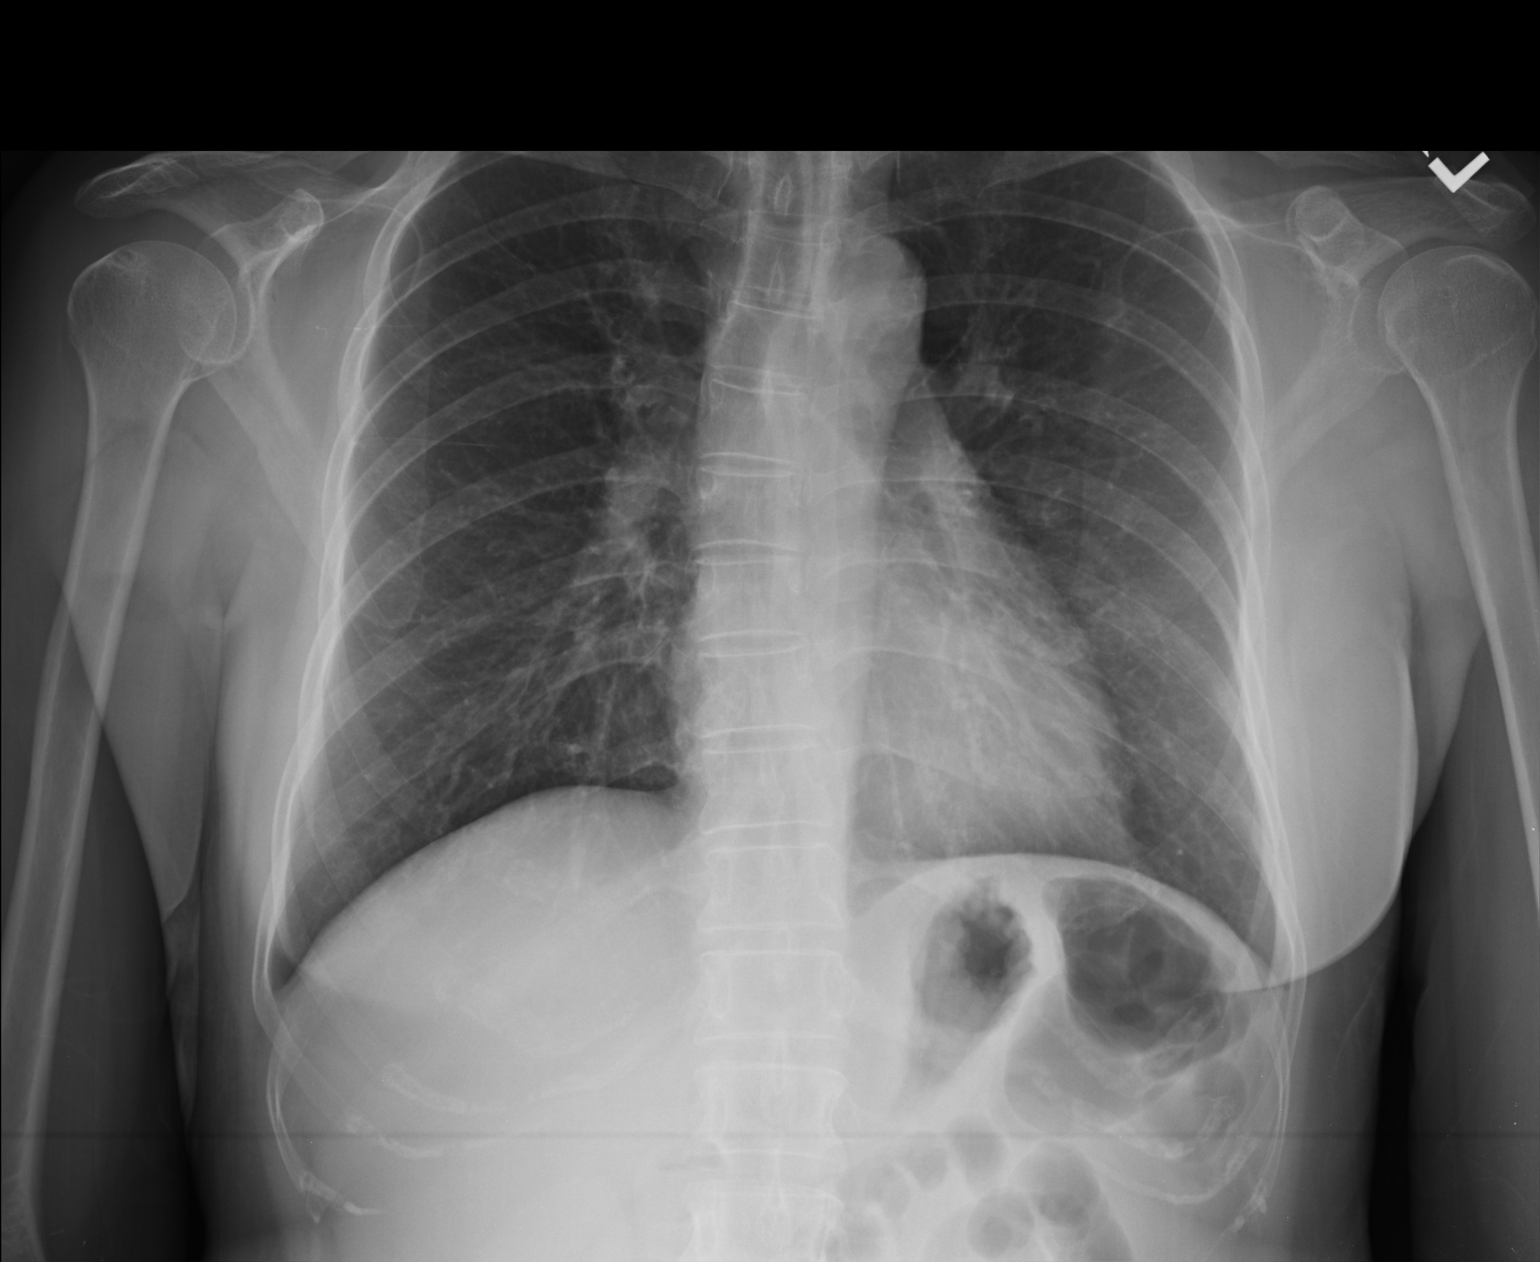

[lateral]
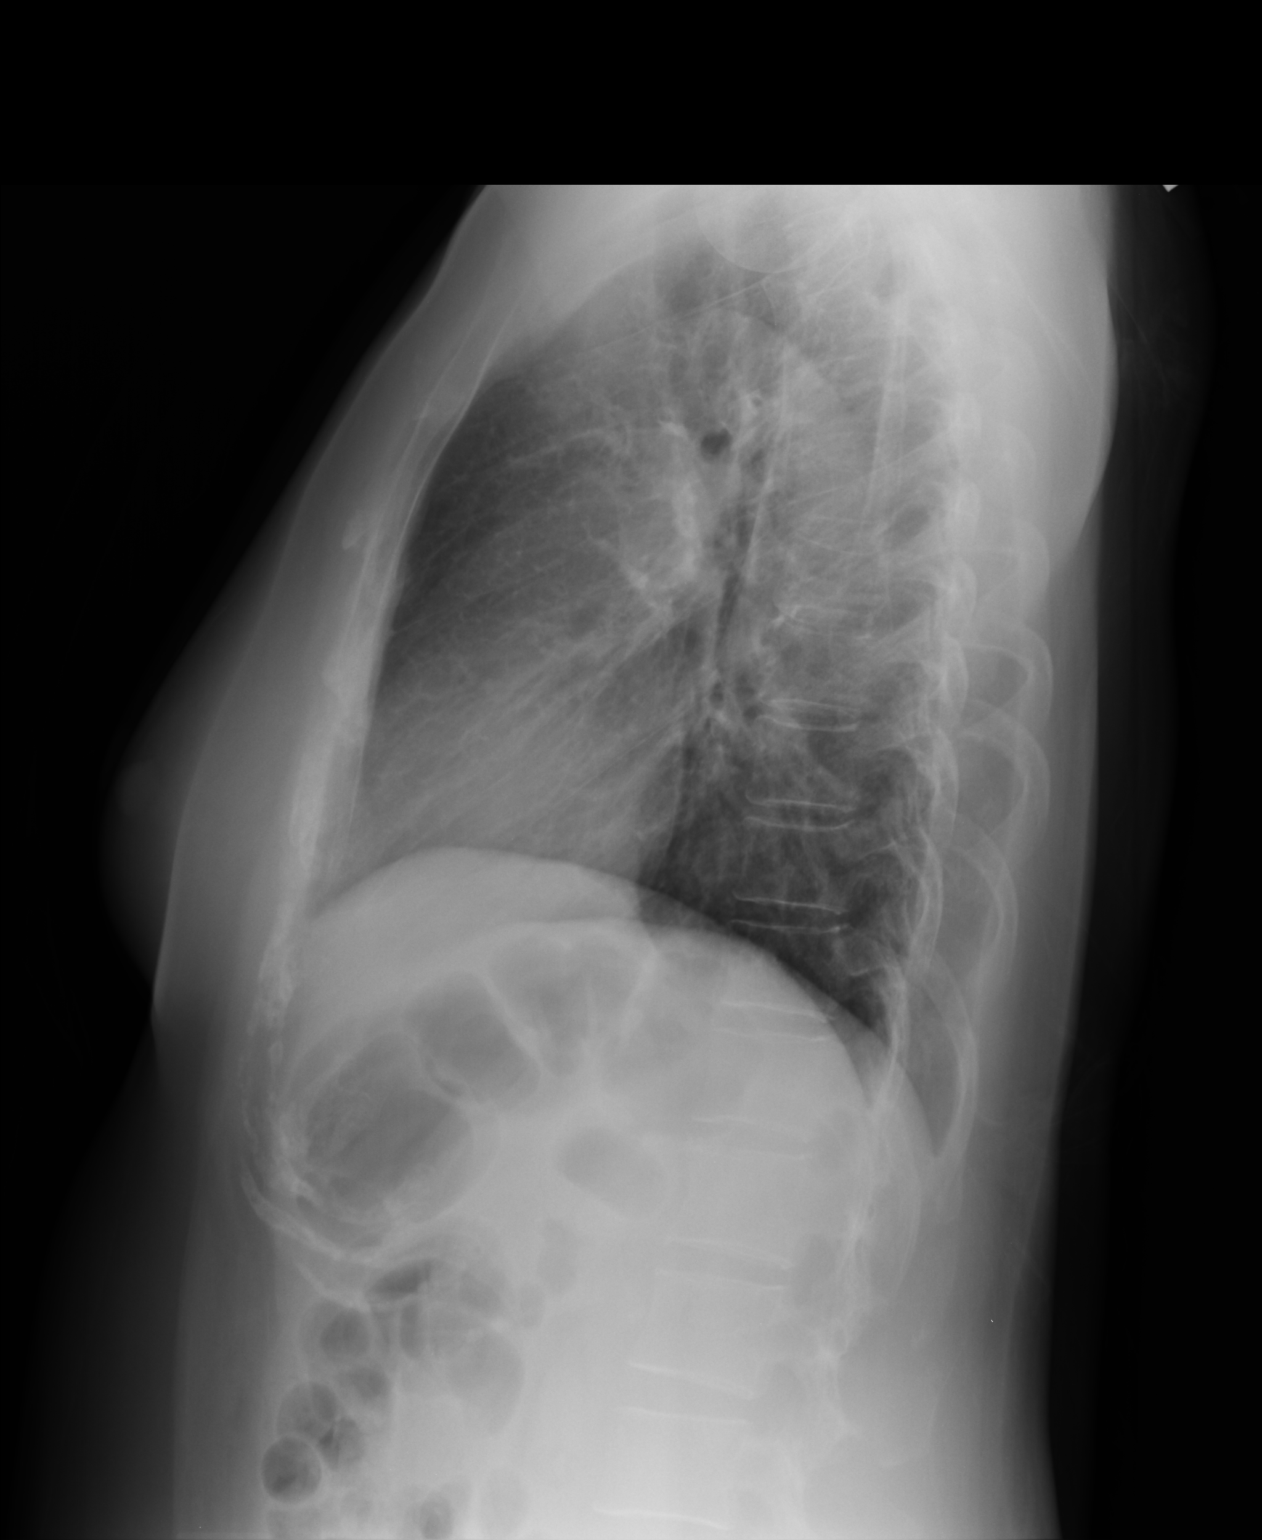

[2 of 2 positions shown; findings below may reference images not displayed]

FINDINGS: Cardiac silhouette is normal in size. Normal mediastinal and hilar
contours. Clear lungs. No pleural effusion or pneumothorax. Mild
dextroscoliosis of the mid thoracic spine, stable.
IMPRESSION: No active cardiopulmonary disease.

## 2017-02-23 ENCOUNTER — Other Ambulatory Visit: Payer: Self-pay | Admitting: Family Medicine

## 2017-02-23 DIAGNOSIS — Z7189 Other specified counseling: Secondary | ICD-10-CM

## 2017-03-21 ENCOUNTER — Other Ambulatory Visit: Payer: Self-pay | Admitting: Internal Medicine

## 2017-03-21 DIAGNOSIS — M79606 Pain in leg, unspecified: Secondary | ICD-10-CM

## 2017-03-22 ENCOUNTER — Ambulatory Visit
Admission: RE | Admit: 2017-03-22 | Discharge: 2017-03-22 | Disposition: A | Discharge status: Home / Self Care | Payer: BLUE CROSS/BLUE SHIELD | Source: Ambulatory Visit | Attending: Internal Medicine | Admitting: Internal Medicine

## 2017-03-22 DIAGNOSIS — M79606 Pain in leg, unspecified: Secondary | ICD-10-CM

## 2018-10-09 ENCOUNTER — Institutional Professional Consult (permissible substitution): Payer: BLUE CROSS/BLUE SHIELD | Admitting: Pulmonary Disease

## 2018-10-19 ENCOUNTER — Other Ambulatory Visit: Payer: Self-pay

## 2018-10-19 ENCOUNTER — Ambulatory Visit (INDEPENDENT_AMBULATORY_CARE_PROVIDER_SITE_OTHER): Payer: BC Managed Care – PPO | Admitting: Pulmonary Disease

## 2018-10-19 ENCOUNTER — Encounter: Payer: Self-pay | Admitting: Pulmonary Disease

## 2018-10-19 VITALS — BP 134/74 | HR 85 | Temp 97.9°F | Ht 62.0 in | Wt 164.2 lb

## 2018-10-19 DIAGNOSIS — J479 Bronchiectasis, uncomplicated: Secondary | ICD-10-CM | POA: Diagnosis not present

## 2018-10-19 NOTE — Progress Notes (Signed)
Jody Brooks    [id number]    09/26/1960  Primary Care Physician:Clark, Daylene Katayama  Referring Physician: Tereasa Coop, PA-C [address] [place],  [place] [address]  Chief complaint: Consult for asthma  HPI: 58 year old with history of bronchiectasis, asthma.  Previously evaluated by Dr. Melvyn Novas in 2015 and lost to follow-up Has recurrent attacks of asthma over the past year requiring prednisone tapers.  Started on Advair last week with improvement in symptoms C/P DOE, cough with white mucus, wheezing.  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  No mold, hot tub, Jacuzzi Smoking history: Never smoker Travel history: Originally from [place].  Came to [place] at age 41.  Previously lived in [place] and [place] Relevant family history: No significant family history of lung disease  ACQ- 6 10/19/2018- 0.86  Outpatient Encounter Medications as of 10/19/2018  Medication Sig  . albuterol (PROVENTIL HFA;VENTOLIN HFA) 108 (90 Base) MCG/ACT inhaler Inhale 1-2 puffs into the lungs every 6 (six) hours as needed for wheezing or shortness of breath.  . Cholecalciferol (VITAMIN D3 PO) Take by mouth daily.  . Fluticasone-Salmeterol (ADVAIR DISKUS) 250-50 MCG/DOSE AEPB Inhale 1 puff into the lungs 2 (two) times daily.  . Omega-3 Fatty Acids (OMEGA-3 FISH OIL PO) Take by mouth daily.  . [DISCONTINUED] ASPIRIN PO Take by mouth.  . [DISCONTINUED] methocarbamol (ROBAXIN-750) 750 MG tablet Take 1 tablet (750 mg total) by mouth every 8 (eight) hours as needed for muscle spasms.  . [DISCONTINUED] predniSONE (DELTASONE) 20 MG tablet Take 2 tablets (40 mg total) by mouth daily with breakfast. X 5 days   No facility-administered encounter medications on file as of 10/19/2018.     Allergies as of 10/19/2018  . (No Known Allergies)    Past Medical History:  Diagnosis Date  . Chest pain   .  Palpitations     Past Surgical History:  Procedure Laterality Date  . ABDOMINAL HYSTERECTOMY  2005  . TUBAL LIGATION      Family History  Problem Relation Age of Onset  . Asthma Daughter   . Cancer Brother     Social History   Socioeconomic History  . Marital status: Married    Spouse name: Not on file  . Number of children: Not on file  . Years of education: Not on file  . Highest education level: Not on file  Occupational History  . Not on file  Social Needs  . Financial resource strain: Not on file  . Food insecurity    Worry: Not on file    Inability: Not on file  . Transportation needs    Medical: Not on file    Non-medical: Not on file  Tobacco Use  . Smoking status: Never Smoker  . Smokeless tobacco: Never Used  Substance and Sexual Activity  . Alcohol use: No    Alcohol/week: 0.0 standard drinks  . Drug use: No  . Sexual activity: Not on file  Lifestyle  . Physical activity    Days per week: Not on file    Minutes per session: Not on file  . Stress: Not on file  Relationships  . Social Herbalist on phone: Not on file    Gets together: Not on file    Attends religious service: Not on file    Active member of club or organization: Not  on file    Attends meetings of clubs or organizations: Not on file    Relationship status: Not on file  . Intimate partner violence    Fear of current or ex partner: Not on file    Emotionally abused: Not on file    Physically abused: Not on file    Forced sexual activity: Not on file  Other Topics Concern  . Not on file  Social History Narrative  . Not on file    Review of systems: Review of Systems  Constitutional: Negative for fever and chills.  HENT: Negative.   Eyes: Negative for blurred vision.  Respiratory: as per HPI  Cardiovascular: Negative for chest pain and palpitations.  Gastrointestinal: Negative for vomiting, diarrhea, blood per rectum. Genitourinary: Negative for dysuria, urgency,  frequency and hematuria.  Musculoskeletal: Negative for myalgias, back pain and joint pain.  Skin: Negative for itching and rash.  Neurological: Negative for dizziness, tremors, focal weakness, seizures and loss of consciousness.  Endo/Heme/Allergies: Negative for environmental allergies.  Psychiatric/Behavioral: Negative for depression, suicidal ideas and hallucinations.  All other systems reviewed and are negative.  Physical Exam: Blood pressure 134/74, pulse 85, temperature 97.9 F (36.6 C), temperature source Oral, height 5\' 2"  (1.575 m), weight 164 lb 3.2 oz (74.5 kg), SpO2 97 %. Gen:      No acute distress HEENT:  EOMI, sclera anicteric Neck:     No masses; no thyromegaly Lungs:    Clear to auscultation bilaterally; normal respiratory effort CV:         Regular rate and rhythm; no murmurs Abd:      + bowel sounds; soft, non-tender; no palpable masses, no distension Ext:    No edema; adequate peripheral perfusion Skin:      Warm and dry; no rash Neuro: alert and oriented x 3 Psych: normal mood and affect  Data Reviewed: Imaging: CTA chest 03/17/2014-pulmonary embolism, mild basilar bronchiectasis, mild adenopathy.  Nodule in the left lobe of thyroid gland I have reviewed images personally.  Labs: WBC 4.8, hemoglobin 13.5, platelets 247, eos 2%, absolute eosinophil count 96  Assessment:  Asthma She has had recurrent exacerbations in the past but has stabilized on Advair Continue current inhaler regimen CBC differential, IgE, PFTs  Bronchiectasis CT in 2015 shows minimal bronchiectasis.  Will get a repeat high-resolution CT to reevaluate Check qauantitative immunoglobulins, alpha-1 antitrypsin, ANA, CCP, rheumatoid factor and cystic fibrosis panel  Plan/Recommendations: - Continue Advair, albuterol as needed - CBC, IgE, work up for bronchiectasis. - High res CT  Marshell Garfinkel MD [facility] 10/19/2018, 4:29 PM  CC: Tereasa Coop, PA-C

## 2018-10-19 NOTE — Patient Instructions (Signed)
Continue the Advair inhaler We will check some labs today including CBC with differential, IgE, quantitative immunoglobulins,  alpha-1 antitrypsin levels and phenotype ANA, rheumatoid factor, CCP and cystic fibrosis panel  Currently for high-resolution CT Follow-up in 2 to 3 months.

## 2018-10-22 LAB — CYSTIC FIBROSIS MUTATION 97

## 2018-10-27 LAB — CBC WITH DIFFERENTIAL/PLATELET
Absolute Monocytes: 435 cells/uL (ref 200–950)
Basophils Absolute: 41 cells/uL (ref 0–200)
Basophils Relative: 0.6 %
Eosinophils Absolute: 156 cells/uL (ref 15–500)
Eosinophils Relative: 2.3 %
HCT: 41 % (ref 35.0–45.0)
Hemoglobin: 13.3 g/dL (ref 11.7–15.5)
Lymphs Abs: 2883 cells/uL (ref 850–3900)
MCH: 26.5 pg — ABNORMAL LOW (ref 27.0–33.0)
MCHC: 32.4 g/dL (ref 32.0–36.0)
MCV: 81.7 fL (ref 80.0–100.0)
MPV: 9.6 fL (ref 7.5–12.5)
Monocytes Relative: 6.4 %
Neutro Abs: 3284 cells/uL (ref 1500–7800)
Neutrophils Relative %: 48.3 %
Platelets: 313 10*3/uL (ref 140–400)
RBC: 5.02 10*6/uL (ref 3.80–5.10)
RDW: 14.8 % (ref 11.0–15.0)
Total Lymphocyte: 42.4 %
WBC: 6.8 10*3/uL (ref 3.8–10.8)

## 2018-10-27 LAB — IMMUNOGLOBULINS A/E/G/M, SERUM
IgA/Immunoglobulin A, Serum: 182 mg/dL (ref 87–352)
IgE (Immunoglobulin E), Serum: 35 IU/mL (ref 6–495)
IgG (Immunoglobin G), Serum: 1282 mg/dL (ref 586–1602)
IgM (Immunoglobulin M), Srm: 82 mg/dL (ref 26–217)

## 2018-10-27 LAB — EXTRA SPECIMEN

## 2018-10-27 LAB — ALPHA-1 ANTITRYPSIN PHENOTYPE: A-1 Antitrypsin, Ser: 126 mg/dL (ref 83–199)

## 2018-10-27 LAB — ANA,IFA RA DIAG PNL W/RFLX TIT/PATN
Anti Nuclear Antibody (ANA): NEGATIVE
Cyclic Citrullin Peptide Ab: <16 UNITS
Rhuematoid fact SerPl-aCnc: <14 IU/mL (ref <14)

## 2018-10-27 LAB — CYSTIC FIBROSIS MUTATION 97: Interpretation: NOT DETECTED

## 2018-11-22 ENCOUNTER — Ambulatory Visit (INDEPENDENT_AMBULATORY_CARE_PROVIDER_SITE_OTHER)
Admission: RE | Admit: 2018-11-22 | Discharge: 2018-11-22 | Disposition: A | Discharge status: Home / Self Care | Payer: BC Managed Care – PPO | Source: Ambulatory Visit | Attending: Pulmonary Disease | Admitting: Pulmonary Disease

## 2018-11-22 ENCOUNTER — Other Ambulatory Visit: Payer: Self-pay

## 2018-11-22 ENCOUNTER — Inpatient Hospital Stay: Admission: RE | Admit: 2018-11-22 | Payer: BC Managed Care – PPO | Source: Ambulatory Visit

## 2018-11-22 DIAGNOSIS — J479 Bronchiectasis, uncomplicated: Secondary | ICD-10-CM

## 2018-12-04 ENCOUNTER — Telehealth: Payer: Self-pay | Admitting: Pulmonary Disease

## 2018-12-04 NOTE — Telephone Encounter (Signed)
Malisa daughter is returning phone call about results.  [place] phone number is [phone].

## 2018-12-04 NOTE — Telephone Encounter (Signed)
Notes recorded by Marshell Garfinkel, MD on 11/30/2018 at 8:36 AM EDT  Please let patient know that there is mild bronchiectasis in the lungs. This is inflammation of airways and is stable from prior scan  Labs are normal   CT also shows gall stones and coronary plaque formation. Please have her follow with her primary care team regarding this. Make follow up appointment in pulmonary clinic.    Called and spoke with pt's daughter Warrick Parisian letting her know the results of pt's CT and also stated to her that pt's labwork was normal. Stated to pt in regards to the CT showing gall stones and coronary plaque formation that they needed to contact PCP to discuss this to see what PCP recommended and [place] verbalized understanding. I stated to her that I was going to fax the results to PCP so they would have it. Also stated to [facility] that we needed to schedule pt a f/u appt and she verbalized understanding. appt has been scheduled for pt with Dr. Vaughan Browner 8/11 at [facility] said she would let pt know this and if that did not work for the appt they would call back and reschedule it.nothing further needed.

## 2018-12-11 ENCOUNTER — Ambulatory Visit: Payer: BC Managed Care – PPO | Admitting: Pulmonary Disease

## 2018-12-11 ENCOUNTER — Encounter: Payer: Self-pay | Admitting: Pulmonary Disease

## 2018-12-11 ENCOUNTER — Other Ambulatory Visit: Payer: Self-pay

## 2018-12-11 VITALS — BP 114/62 | HR 76 | Temp 98.2°F | Ht 62.0 in | Wt 166.0 lb

## 2018-12-11 DIAGNOSIS — J479 Bronchiectasis, uncomplicated: Secondary | ICD-10-CM

## 2018-12-11 NOTE — Progress Notes (Signed)
         Jody Brooks    [id number]    1960/11/02  Primary Care Physician:Patient, No Pcp Per  Referring Physician: Tereasa Coop, PA-C [address] [place],  [place] [address]  Chief complaint: Follow-up for asthma, bronchiectasis  HPI: 58 year old with history of bronchiectasis, asthma.  Previously evaluated by Dr. Melvyn Novas in 2015 and lost to follow-up Has recurrent attacks of asthma over the past year requiring prednisone tapers.  Started on Advair last week with improvement in symptoms C/P DOE, cough with white mucus, wheezing.  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  No mold, hot tub, Jacuzzi Smoking history: Never smoker Travel history: Originally from [place].  Came to [place] at age 45.  Previously lived in [place] and [place] Relevant family history: No significant family history of lung disease  ACQ- 6 10/19/2018- 0.86  Interim history: States that breathing is doing well Continues on Advair.    Outpatient Encounter Medications as of 12/11/2018  Medication Sig  . albuterol (PROVENTIL HFA;VENTOLIN HFA) 108 (90 Base) MCG/ACT inhaler Inhale 1-2 puffs into the lungs every 6 (six) hours as needed for wheezing or shortness of breath.  . Cholecalciferol (VITAMIN D3 PO) Take by mouth daily.  . Fluticasone-Salmeterol (ADVAIR DISKUS) 250-50 MCG/DOSE AEPB Inhale 1 puff into the lungs 2 (two) times daily.  . Omega-3 Fatty Acids (OMEGA-3 FISH OIL PO) Take by mouth daily.   No facility-administered encounter medications on file as of 12/11/2018.    Physical Exam: Blood pressure 114/62, pulse 76, temperature 98.2 F (36.8 C), temperature source Oral, height 5\' 2"  (1.575 m), weight 166 lb (75.3 kg), SpO2 99 %. Gen:      No acute distress HEENT:  EOMI, sclera anicteric Neck:     No masses; no thyromegaly Lungs:    Clear to auscultation bilaterally; normal respiratory effort CV:          Regular rate and rhythm; no murmurs Abd:      + bowel sounds; soft, non-tender; no palpable masses, no distension Ext:    No edema; adequate peripheral perfusion Skin:      Warm and dry; no rash Neuro: alert and oriented x 3 Psych: normal mood and affect  Data Reviewed: Imaging: CTA chest 03/17/2014-pulmonary embolism, mild basilar bronchiectasis, mild adenopathy.  Nodule in the left lobe of thyroid gland I have reviewed images personally.  Labs: CBC 10/19/2018-WBC 6.8, eos 2.3%, absolute eosinophil count 156 Quantitative immunoglobulin 10/19/2018-within normal limits ANA, rheumatoid factor, CCP 10/19/2018-negative CF panel 10/19/2018-negative  Assessment:  Asthma She has had recurrent exacerbations in the past but has stabilized on Advair Continue current inhaler regimen PFTs are pending.  Bronchiectasis High-res CT reviewed with mild bronchiectasis.  Work-up including immunoglobulins, alpha-1 antitrypsin, ANA, CCP, rheumatoid factor and cystic fibrosis panel is negative.  Plan/Recommendations: - Continue Advair, albuterol as needed - PFTs  Marshell Garfinkel MD [facility] 12/11/2018, 4:27 PM  CC: Tereasa Coop, PA-C

## 2018-12-11 NOTE — Progress Notes (Signed)
Shawnna Pancake    [id number]    [birth year]  Primary Care Physician:Patient, No Pcp Per  Referring Physician: Tereasa Coop, PA-C [address] [place],  [place] [address]  Chief complaint: Consult for asthma  HPI: 58 year old with history of bronchiectasis, asthma.  Previously evaluated by Dr. Melvyn Novas in 2015 and lost to follow-up Has recurrent attacks of asthma over the past year requiring prednisone tapers.  Started on Advair last week with improvement in symptoms C/P DOE, cough with white mucus, wheezing.  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  No mold, hot tub, Jacuzzi Smoking history: Never smoker Travel history: Originally from [place].  Came to [place] at age 64.  Previously lived in [place] and [place] Relevant family history: No significant family history of lung disease  ACQ- 6 10/19/2018- 0.86  Outpatient Encounter Medications as of 12/11/2018  Medication Sig  . albuterol (PROVENTIL HFA;VENTOLIN HFA) 108 (90 Base) MCG/ACT inhaler Inhale 1-2 puffs into the lungs every 6 (six) hours as needed for wheezing or shortness of breath.  . Cholecalciferol (VITAMIN D3 PO) Take by mouth daily.  . Fluticasone-Salmeterol (ADVAIR DISKUS) 250-50 MCG/DOSE AEPB Inhale 1 puff into the lungs 2 (two) times daily.  . Omega-3 Fatty Acids (OMEGA-3 FISH OIL PO) Take by mouth daily.   No facility-administered encounter medications on file as of 12/11/2018.     Allergies as of 12/11/2018  . (No Known Allergies)    Past Medical History:  Diagnosis Date  . Chest pain   . Palpitations     Past Surgical History:  Procedure Laterality Date  . ABDOMINAL HYSTERECTOMY  2005  . TUBAL LIGATION      Family History  Problem Relation Age of Onset  . Asthma Daughter   . Cancer Brother     Social History   Socioeconomic History  . Marital status: Married    Spouse name: Not on file   . Number of children: Not on file  . Years of education: Not on file  . Highest education level: Not on file  Occupational History  . Not on file  Social Needs  . Financial resource strain: Not on file  . Food insecurity    Worry: Not on file    Inability: Not on file  . Transportation needs    Medical: Not on file    Non-medical: Not on file  Tobacco Use  . Smoking status: Never Smoker  . Smokeless tobacco: Never Used  Substance and Sexual Activity  . Alcohol use: No    Alcohol/week: 0.0 standard drinks  . Drug use: No  . Sexual activity: Not on file  Lifestyle  . Physical activity    Days per week: Not on file    Minutes per session: Not on file  . Stress: Not on file  Relationships  . Social Herbalist on phone: Not on file    Gets together: Not on file    Attends religious service: Not on file    Active member of club or organization: Not on file    Attends meetings of clubs or organizations: Not on file    Relationship status: Not on file  . Intimate partner violence    Fear of current or ex partner: Not on file    Emotionally abused: Not on file    Physically abused: Not on file  Forced sexual activity: Not on file  Other Topics Concern  . Not on file  Social History Narrative  . Not on file    Review of systems: Review of Systems  Constitutional: Negative for fever and chills.  HENT: Negative.   Eyes: Negative for blurred vision.  Respiratory: as per HPI  Cardiovascular: Negative for chest pain and palpitations.  Gastrointestinal: Negative for vomiting, diarrhea, blood per rectum. Genitourinary: Negative for dysuria, urgency, frequency and hematuria.  Musculoskeletal: Negative for myalgias, back pain and joint pain.  Skin: Negative for itching and rash.  Neurological: Negative for dizziness, tremors, focal weakness, seizures and loss of consciousness.  Endo/Heme/Allergies: Negative for environmental allergies.  Psychiatric/Behavioral:  Negative for depression, suicidal ideas and hallucinations.  All other systems reviewed and are negative.  Physical Exam: Blood pressure 134/74, pulse 85, temperature 97.9 F (36.6 C), temperature source Oral, height 5\' 2"  (1.575 m), weight 164 lb 3.2 oz (74.5 kg), SpO2 97 %. Gen:      No acute distress HEENT:  EOMI, sclera anicteric Neck:     No masses; no thyromegaly Lungs:    Clear to auscultation bilaterally; normal respiratory effort CV:         Regular rate and rhythm; no murmurs Abd:      + bowel sounds; soft, non-tender; no palpable masses, no distension Ext:    No edema; adequate peripheral perfusion Skin:      Warm and dry; no rash Neuro: alert and oriented x 3 Psych: normal mood and affect  Data Reviewed: Imaging: CTA chest 03/17/2014-pulmonary embolism, mild basilar bronchiectasis, mild adenopathy.  Nodule in the left lobe of thyroid gland I have reviewed images personally.  Labs: WBC 4.8, hemoglobin 13.5, platelets 247, eos 2%, absolute eosinophil count 96  Assessment:  Asthma She has had recurrent exacerbations in the past but has stabilized on Advair Continue current inhaler regimen CBC differential, IgE, PFTs  Bronchiectasis CT in 2015 shows minimal bronchiectasis.  Will get a repeat high-resolution CT to reevaluate Check qauantitative immunoglobulins, alpha-1 antitrypsin, ANA, CCP, rheumatoid factor and cystic fibrosis panel  Plan/Recommendations: - Continue Advair, albuterol as needed - CBC, IgE, work up for bronchiectasis. - High res CT  Marshell Garfinkel MD [facility] 12/11/2018, 4:28 PM  CC: Tereasa Coop, PA-C

## 2018-12-11 NOTE — Patient Instructions (Signed)
Continue your current medication including Advair and albuterol as needed Will order pulmonary function test in 3 months time. Follow-up after pulmonary function test.

## 2021-06-25 ENCOUNTER — Other Ambulatory Visit: Payer: Self-pay | Admitting: Physician Assistant

## 2021-06-25 DIAGNOSIS — Z1231 Encounter for screening mammogram for malignant neoplasm of breast: Secondary | ICD-10-CM

## 2021-08-03 ENCOUNTER — Encounter: Payer: Self-pay | Admitting: Gastroenterology

## 2021-09-02 ENCOUNTER — Ambulatory Visit: Payer: BC Managed Care – PPO | Admitting: Gastroenterology

## 2021-09-02 ENCOUNTER — Encounter: Payer: Self-pay | Admitting: Gastroenterology

## 2021-09-02 VITALS — BP 148/72 | HR 90 | Ht 62.0 in | Wt 168.0 lb

## 2021-09-02 DIAGNOSIS — Z1211 Encounter for screening for malignant neoplasm of colon: Secondary | ICD-10-CM

## 2021-09-02 MED ORDER — NA SULFATE-K SULFATE-MG SULF 17.5-3.13-1.6 GM/177ML PO SOLN: 1.0000 | Freq: Once | ORAL | 0 refills | Status: AC

## 2021-09-02 NOTE — Patient Instructions (Signed)
It was my pleasure to provide care to you today. Based on our discussion, I am providing you with my recommendations below: ? ?RECOMMENDATION(S):  ? ? ?COLONOSCOPY:  ? ?You have been scheduled for a colonoscopy. Please follow written instructions given to you at your visit today.  ? ?PREP:  ? ?Please pick up your prep supplies at the pharmacy within the next 1-3 days. ? ?INHALERS:  ? ?If you use inhalers (even only as needed), please bring them with you on the day of your procedure. ? ?COLONOSCOPY TIPS: ? ?To reduce nausea and dehydration, stay well hydrated for 3-4 days prior to the exam.  ?To prevent skin/hemorrhoid irritation - prior to wiping, put A&Dointment or vaseline on the toilet paper. ?Keep a towel or pad on the bed.  ?BEFORE STARTING YOUR PREP, drink  64oz of clear liquids in the morning. This will help to flush the colon and will ensure you are well hydrated!!!!  ?NOTE - This is in addition to the fluids required for to complete your prep. ?Use of a flavored hard candy, such as grape Anise Salvo, can counteract some of the flavor of the prep and may prevent some nausea.  ? ? ?FOLLOW UP: ? ?After your procedure, you will receive a call from my office staff regarding my recommendation for follow up. ? ?BMI: ? ?If you are age 39 or older, your body mass index should be between 23-30. Your Body mass index is 30.73 kg/m?Marland Kitchen If this is out of the aforementioned range listed, please consider follow up with your Primary Care Provider. ? ?If you are age 76 or younger, your body mass index should be between 19-25. Your Body mass index is 30.73 kg/m?Marland Kitchen If this is out of the aformentioned range listed, please consider follow up with your Primary Care Provider.  ? ?MY CHART: ? ?The [facility] providers would like to encourage you to use [facility] to communicate with providers for non-urgent requests or questions.  Due to long hold times on the telephone, sending your provider a message by [facility] may be a faster and  more efficient way to get a response.  Please allow 48 business hours for a response.  Please remember that this is for non-urgent requests.  ? ?Thank you for trusting me with your gastrointestinal care!   ? ?Thornton Park, MD, MPH ? ?

## 2021-09-02 NOTE — Progress Notes (Signed)
? ?Referring Provider: Trey Sailors, PA ?Primary Care Physician:  Trey Sailors, PA ? ?Reason for Consultation:  Colon cancer screening ? ? ?IMPRESSION:  ?Need for colon cancer screening  ?She is an appropriate candidate for monitored anesthesia care in the [facility] ? ? ?PLAN: ?Colonoscopy at her convenience ?Obtain prior colonoscopy results from [facility] ? ? ?HPI: Jody Brooks is a 61 y.o. female who presents for colon cancer screening. She has a history of asthma and hypercholesterolemia. She is originally from [place].  ? ?Colonoscopy over 10 years ago at [facility]. She remembers the procedure being normal. No problems with the prep, procedure, or recovery.  ? ?GI ROS is essentially negative.  ? ?There is no known family history of colon cancer or polyps. No family history of stomach cancer or other GI malignancy. No family history of inflammatory bowel disease or celiac.  ? ? ?Past Medical History:  ?Diagnosis Date  ? Asthma   ? mild  ? Chest pain   ? Hyperlipidemia   ? Palpitations   ? Seasonal allergies   ? ? ?Past Surgical History:  ?Procedure Laterality Date  ? ABDOMINAL HYSTERECTOMY  05/03/2003  ? KNEE SURGERY Bilateral   ? NASAL SINUS SURGERY  2010  ? TUBAL LIGATION    ? ? ? ? ?Current Outpatient Medications  ?Medication Sig Dispense Refill  ? albuterol (PROVENTIL HFA;VENTOLIN HFA) 108 (90 Base) MCG/ACT inhaler Inhale 1-2 puffs into the lungs every 6 (six) hours as needed for wheezing or shortness of breath. 1 Inhaler 1  ? atorvastatin (LIPITOR) 20 MG tablet Take 20 mg by mouth daily.    ? cetirizine (ZYRTEC) 10 MG tablet Take 10 mg by mouth daily.    ? cromolyn (OPTICROM) 4 % ophthalmic solution 1 drop in the morning, at noon, in the evening, and at bedtime.    ? Fluticasone-Salmeterol (ADVAIR) 250-50 MCG/DOSE AEPB Inhale 1 puff into the lungs 2 (two) times daily as needed.    ? ?No current facility-administered medications for this visit.  ? ? ?Allergies as of 09/02/2021  ? (No Known Allergies)   ? ? ?Family History  ?Problem Relation Age of Onset  ? Cancer Brother   ?     bone marrow cancer  ? Asthma Daughter   ? Colon cancer Neg Hx   ? Stomach cancer Neg Hx   ? Esophageal cancer Neg Hx   ? ? ?Social History  ? ?Socioeconomic History  ? Marital status: Married  ?  Spouse name: Not on file  ? Number of children: Not on file  ? Years of education: Not on file  ? Highest education level: Not on file  ?Occupational History  ? Not on file  ?Tobacco Use  ? Smoking status: Never  ? Smokeless tobacco: Never  ?Vaping Use  ? Vaping Use: Never used  ?Substance and Sexual Activity  ? Alcohol use: Yes  ?  Comment: rarely  ? Drug use: Yes  ? Sexual activity: Not on file  ?Other Topics Concern  ? Not on file  ?Social History Narrative  ? Not on file  ? ?Social Determinants of Health  ? ?Financial Resource Strain: Not on file  ?Food Insecurity: Not on file  ?Transportation Needs: Not on file  ?Physical Activity: Not on file  ?Stress: Not on file  ?Social Connections: Not on file  ?Intimate Partner Violence: Not on file  ? ? ?Review of Systems: ?12 system ROS is negative except as noted above.  ? ?Physical  Exam: ?General:   Alert,  well-nourished, pleasant and cooperative in NAD ?Head:  Normocephalic and atraumatic. ?Eyes:  Sclera clear, no icterus.   Conjunctiva pink. ?Ears:  Normal auditory acuity. ?Nose:  No deformity, discharge,  or lesions. ?Mouth:  No deformity or lesions.   ?Neck:  Supple; no masses or thyromegaly. ?Lungs:  Clear throughout to auscultation.   No wheezes. ?Heart:  Regular rate and rhythm; no murmurs. ?Abdomen:  Soft, nontender, nondistended, normal bowel sounds, no rebound or guarding. No hepatosplenomegaly.   ?Rectal:  Deferred  ?Msk:  Symmetrical. No boney deformities ?LAD: No inguinal or umbilical LAD ?Extremities:  No clubbing or edema. ?Neurologic:  Alert and  oriented x4;  grossly nonfocal ?Skin:  Intact without significant lesions or rashes. ?Psych:  Alert and cooperative. Normal mood and  affect. ? ? ? ? ?Hadrian Yarbrough L. Tarri Glenn, MD, MPH ?09/02/2021, 3:08 PM ? ? ? ?  ?

## 2021-09-13 ENCOUNTER — Encounter: Payer: Self-pay | Admitting: Gastroenterology

## 2021-09-20 ENCOUNTER — Ambulatory Visit (AMBULATORY_SURGERY_CENTER): Payer: BC Managed Care – PPO | Admitting: Gastroenterology

## 2021-09-20 ENCOUNTER — Encounter: Payer: Self-pay | Admitting: Gastroenterology

## 2021-09-20 VITALS — BP 149/83 | HR 70 | Temp 98.6°F | Resp 16 | Ht 62.0 in | Wt 168.0 lb

## 2021-09-20 DIAGNOSIS — Z1211 Encounter for screening for malignant neoplasm of colon: Secondary | ICD-10-CM

## 2021-09-20 DIAGNOSIS — K635 Polyp of colon: Secondary | ICD-10-CM

## 2021-09-20 DIAGNOSIS — D120 Benign neoplasm of cecum: Secondary | ICD-10-CM

## 2021-09-20 MED ORDER — SODIUM CHLORIDE 0.9 % IV SOLN: 500.0000 mL | Freq: Once | INTRAVENOUS | Status: DC

## 2021-09-20 NOTE — Progress Notes (Signed)
Called to room to assist during endoscopic procedure.  Patient ID and intended procedure confirmed with present staff. Received instructions for my participation in the procedure from the performing physician.  

## 2021-09-20 NOTE — Progress Notes (Signed)
Pt's states no medical or surgical changes since previsit or office visit. VS assessed by C.W 

## 2021-09-20 NOTE — Progress Notes (Signed)
Indication for colonoscopy: Colon cancer screening  Please see my 5//23 office visit for complete details.  There is been no change in history or physical exam since that time.  She remains an appropriate candidate for monitored anesthesia care in the endoscopy center.

## 2021-09-20 NOTE — Op Note (Signed)
[facility] Patient Name: Jody Brooks Procedure Date: 09/20/2021 3:06 PM MRN: [id number] Endoscopist: Thornton Park MD, MD Age: 61 Referring MD:  Date of Birth: June 05, 1960 Gender: Female Account #: [id number] Procedure:                Colonoscopy Indications:              Screening for colorectal malignant neoplasm, This                            is the patient's first colonoscopy                           Prior colonoscopy 10 years ago at [facility]                           No known family history of colon cancer or polyps Medicines:                Monitored Anesthesia Care Procedure:                Pre-Anesthesia Assessment:                           - Prior to the procedure, a History and Physical                            was performed, and patient medications and                            allergies were reviewed. The patient's tolerance of                            previous anesthesia was also reviewed. The risks                            and benefits of the procedure and the sedation                            options and risks were discussed with the patient.                            All questions were answered, and informed consent                            was obtained. Prior Anticoagulants: The patient has                            taken no previous anticoagulant or antiplatelet                            agents. ASA Grade Assessment: II - A patient with                            mild systemic disease. After reviewing the risks  and benefits, the patient was deemed in                            satisfactory condition to undergo the procedure.                           After obtaining informed consent, the colonoscope                            was passed under direct vision. Throughout the                            procedure, the patient's blood pressure, pulse, and                            oxygen saturations were  monitored continuously. The                            CF [id number] #[id number] was introduced through the anus                            and advanced to the 3 cm into the ileum. A second                            forward view of the right colon was performed. The                            colonoscopy was performed without difficulty. The                            patient tolerated the procedure well. The quality                            of the bowel preparation was good. The terminal                            ileum, ileocecal valve, appendiceal orifice, and                            rectum were photographed. Scope In: 3:18:21 PM Scope Out: 3:34:00 PM Scope Withdrawal Time: 0 hours 12 minutes 8 seconds  Total Procedure Duration: 0 hours 15 minutes 39 seconds  Findings:                 Non-bleeding external and internal hemorrhoids were                            found.                           Multiple small and large-mouthed diverticula were                            found in the sigmoid colon and descending colon.  A less than 1 mm polyp was found in the cecum. The                            polyp was flat. The polyp was removed with a                            piecemeal technique using a cold snare. Resection                            and retrieval were complete. Estimated blood loss                            was minimal.                           The exam was otherwise without abnormality on                            direct and retroflexion views. Complications:            No immediate complications. Estimated Blood Loss:     Estimated blood loss was minimal. Impression:               - Non-bleeding external and internal hemorrhoids.                           - Diverticulosis in the sigmoid colon and in the                            descending colon.                           - One less than 1 mm polyp in the cecum, removed                             piecemeal using a cold snare. Resected and                            retrieved.                           - The examination was otherwise normal on direct                            and retroflexion views. Recommendation:           - Patient has a contact number available for                            emergencies. The signs and symptoms of potential                            delayed complications were discussed with the                            patient. Return  to normal activities tomorrow.                            Written discharge instructions were provided to the                            patient.                           - Continue present medications.                           - Await pathology results.                           - Repeat colonoscopy date to be determined after                            pending pathology results are reviewed for                            surveillance.                           - Follow a high fiber diet. Drink at least 64                            ounces of water daily. Add a daily stool bulking                            agent such as psyllium (an exampled would be                            Metamucil).                           - Emerging evidence supports eating a diet of                            fruits, vegetables, grains, calcium, and yogurt                            while reducing red meat and alcohol may reduce the                            risk of colon cancer.                           - Thank you for allowing me to be involved in your                            colon cancer prevention. Thornton Park MD, MD 09/20/2021 3:38:51 PM This report has been signed electronically.

## 2021-09-20 NOTE — Patient Instructions (Signed)
Handouts on hemorrhoids, diverticulosis, polyps, and high fiber diet given to patient. Await pathology results. Resume previous diet and continue present medications. Repeat colonoscopy for surveillance based off of pathology results.   YOU HAD AN ENDOSCOPIC PROCEDURE TODAY AT [facility]:   Refer to the procedure report that was given to you for any specific questions about what was found during the examination.  If the procedure report does not answer your questions, please call your gastroenterologist to clarify.  If you requested that your care partner not be given the details of your procedure findings, then the procedure report has been included in a sealed envelope for you to review at your convenience later.  YOU SHOULD EXPECT: Some feelings of bloating in the abdomen. Passage of more gas than usual.  Walking can help get rid of the air that was put into your GI tract during the procedure and reduce the bloating. If you had a lower endoscopy (such as a colonoscopy or flexible sigmoidoscopy) you may notice spotting of blood in your stool or on the toilet paper. If you underwent a bowel prep for your procedure, you may not have a normal bowel movement for a few days.  Please Note:  You might notice some irritation and congestion in your nose or some drainage.  This is from the oxygen used during your procedure.  There is no need for concern and it should clear up in a day or so.  SYMPTOMS TO REPORT IMMEDIATELY:  Following lower endoscopy (colonoscopy or flexible sigmoidoscopy):  Excessive amounts of blood in the stool  Significant tenderness or worsening of abdominal pains  Swelling of the abdomen that is new, acute  Fever of 100F or higher  For urgent or emergent issues, a gastroenterologist can be reached at any hour by calling [phone]. Do not use MyChart messaging for urgent concerns.    DIET:  We do recommend a small meal at first, but then you may proceed  to your regular diet.  Drink plenty of fluids but you should avoid alcoholic beverages for 24 hours.  ACTIVITY:  You should plan to take it easy for the rest of today and you should NOT DRIVE or use heavy machinery until tomorrow (because of the sedation medicines used during the test).    FOLLOW UP: Our staff will call the number listed on your records 48-72 hours following your procedure to check on you and address any questions or concerns that you may have regarding the information given to you following your procedure. If we do not reach you, we will leave a message.  We will attempt to reach you two times.  During this call, we will ask if you have developed any symptoms of COVID 19. If you develop any symptoms (ie: fever, flu-like symptoms, shortness of breath, cough etc.) before then, please call [phone].  If you test positive for Covid 19 in the 2 weeks post procedure, please call and report this information to [place].    If any biopsies were taken you will be contacted by phone or by letter within the next 1-3 weeks.  Please call us at [phone] if you have not heard about the biopsies in 3 weeks.    SIGNATURES/CONFIDENTIALITY: You and/or your care partner have signed paperwork which will be entered into your electronic medical record.  These signatures attest to the fact that that the information above on your After Visit Summary has been reviewed and is understood.  Full responsibility  of the confidentiality of this discharge information lies with you and/or your care-partner.

## 2021-09-21 ENCOUNTER — Telehealth: Payer: Self-pay

## 2021-09-21 NOTE — Telephone Encounter (Signed)
  Follow up Call-     09/20/2021    2:38 PM  Call back number  Post procedure Call Back phone  # [phone]  Permission to leave phone message Yes     Patient questions:  Do you have a fever, pain , or abdominal swelling? No. Pain Score  0 *  Have you tolerated food without any problems? Yes.    Have you been able to return to your normal activities? Yes.    Do you have any questions about your discharge instructions: Diet   No. Medications  No. Follow up visit  No.  Do you have questions or concerns about your Care? No.  Actions: * If pain score is 4 or above: No action needed, pain <4.

## 2021-09-24 ENCOUNTER — Encounter: Payer: Self-pay | Admitting: Gastroenterology

## 2022-08-17 ENCOUNTER — Other Ambulatory Visit: Payer: Self-pay | Admitting: Physician Assistant

## 2022-08-17 DIAGNOSIS — Z1231 Encounter for screening mammogram for malignant neoplasm of breast: Secondary | ICD-10-CM

## 2022-08-29 ENCOUNTER — Ambulatory Visit
Admission: RE | Admit: 2022-08-29 | Discharge: 2022-08-29 | Disposition: A | Discharge status: Home / Self Care | Payer: Medicaid Other | Source: Ambulatory Visit | Attending: Physician Assistant | Admitting: Physician Assistant

## 2022-08-29 DIAGNOSIS — Z1231 Encounter for screening mammogram for malignant neoplasm of breast: Secondary | ICD-10-CM

## 2022-08-31 ENCOUNTER — Other Ambulatory Visit: Payer: Self-pay | Admitting: Physician Assistant

## 2022-08-31 DIAGNOSIS — R928 Other abnormal and inconclusive findings on diagnostic imaging of breast: Secondary | ICD-10-CM

## 2022-09-15 ENCOUNTER — Ambulatory Visit
Admission: RE | Admit: 2022-09-15 | Discharge: 2022-09-15 | Disposition: A | Discharge status: Home / Self Care | Payer: Medicaid Other | Source: Ambulatory Visit | Attending: Physician Assistant | Admitting: Physician Assistant

## 2022-09-15 DIAGNOSIS — R928 Other abnormal and inconclusive findings on diagnostic imaging of breast: Secondary | ICD-10-CM

## 2022-09-28 ENCOUNTER — Other Ambulatory Visit: Payer: Self-pay | Admitting: Physician Assistant

## 2022-09-28 DIAGNOSIS — N6310 Unspecified lump in the right breast, unspecified quadrant: Secondary | ICD-10-CM

## 2022-11-28 NOTE — Therapy (Deleted)
OUTPATIENT PHYSICAL THERAPY SHOULDER EVALUATION   Patient Name: Jody Brooks MRN: [id number] DOB:February 13, 1961, 62 y.o., female Today's Date: 11/28/2022  END OF SESSION:   Past Medical History:  Diagnosis Date   Allergy    Asthma    mild   Chest pain    Hyperlipidemia    Palpitations    Seasonal allergies    Past Surgical History:  Procedure Laterality Date   ABDOMINAL HYSTERECTOMY  05/03/2003   KNEE SURGERY Bilateral    NASAL SINUS SURGERY  2010   TUBAL LIGATION     Patient Active Problem List   Diagnosis Date Noted   Bronchiectasis without acute exacerbation (HCC) 03/18/2014    PCP: Norm Salt, PA  REFERRING PROVIDER: Vernetta Honey, PA-C  REFERRING DIAG: ***  THERAPY DIAG:  No diagnosis found.  Rationale for Evaluation and Treatment: Rehabilitation  ONSET DATE: 11/17/22  SUBJECTIVE:                                                                                                                                                                                      SUBJECTIVE STATEMENT: *** Hand dominance: {MISC; OT HAND [phone]}  PERTINENT HISTORY: Per primary Dr note referring patient to ortho Shoulder:       Shoulder: left.        Inspection: Normal Shoulder Contour.        Range of Motion: painful ROM BUT FULL.        Strength: diminished.        Palpation: no crepitations, tenderness, subacromial region, moderate.        Painful arc: 90 degrees.    PAIN:  Are you having pain? {OPRCPAIN:27236}  PRECAUTIONS: {Therapy precautions:24002}  RED FLAGS: {PT Red Flags:29287}   WEIGHT BEARING RESTRICTIONS: {Yes ***/No:24003}  FALLS:  Has patient fallen in last 6 months? {fallsyesno:27318}  LIVING ENVIRONMENT: Lives with: {OPRC lives with:25569::"lives with their family"} Lives in: {Lives in:25570} Stairs: {opstairs:27293} Has following equipment at home: {Assistive devices:23999}  OCCUPATION: ***  PLOF:  {PLOF:24004}  PATIENT GOALS:***  NEXT MD VISIT:   OBJECTIVE:   DIAGNOSTIC FINDINGS:  ***  PATIENT SURVEYS:  {rehab surveys:24030:a}  COGNITION: Overall cognitive status: {cognition:24006}     SENSATION: {sensation:27233}  POSTURE: ***  UPPER EXTREMITY ROM:   {AROM/PROM:27142} ROM Right eval Left eval  Shoulder flexion    Shoulder extension    Shoulder abduction    Shoulder adduction    Shoulder internal rotation    Shoulder external rotation    Elbow flexion    Elbow extension    Wrist flexion    Wrist extension    Wrist ulnar deviation    Wrist  radial deviation    Wrist pronation    Wrist supination    (Blank rows = not tested)  UPPER EXTREMITY MMT:  MMT Right eval Left eval  Shoulder flexion    Shoulder extension    Shoulder abduction    Shoulder adduction    Shoulder internal rotation    Shoulder external rotation    Middle trapezius    Lower trapezius    Elbow flexion    Elbow extension    Wrist flexion    Wrist extension    Wrist ulnar deviation    Wrist radial deviation    Wrist pronation    Wrist supination    Grip strength (lbs)    (Blank rows = not tested)  SHOULDER SPECIAL TESTS: Impingement tests: {shoulder impingement test:25231:a} SLAP lesions: {SLAP lesions:25232} Instability tests: {shoulder instability test:25233} Rotator cuff assessment: {rotator cuff assessment:25234} Biceps assessment: {biceps assessment:25235}  JOINT MOBILITY TESTING:  ***  PALPATION:  ***   TODAY'S TREATMENT:                                                                                                                                         DATE:  11/29/22 Education   PATIENT EDUCATION: Education details: POC Person educated: Patient Education method: Explanation Education comprehension: verbalized understanding  HOME EXERCISE PROGRAM: ***  ASSESSMENT:  CLINICAL IMPRESSION: Patient is a 62 y.o. who was seen today for physical  therapy evaluation and treatment for ***.   OBJECTIVE IMPAIRMENTS: decreased activity tolerance, decreased coordination, decreased ROM, decreased strength, hypomobility, impaired flexibility, impaired UE functional use, and improper body mechanics.   ACTIVITY LIMITATIONS: carrying, lifting, bathing, dressing, reach over head, and hygiene/grooming  PARTICIPATION LIMITATIONS: cleaning, laundry, driving, and community activity  PERSONAL FACTORS: Past/current experiences are also affecting patient's functional outcome.   REHAB POTENTIAL: Good  CLINICAL DECISION MAKING: Stable/uncomplicated  EVALUATION COMPLEXITY: Low   GOALS: Goals reviewed with patient? Yes  SHORT TERM GOALS: Target date: 12/14/22  I with initial HEP Baseline: Goal status: INITIAL  2.  *** Baseline:  Goal status: INITIAL  3.  *** Baseline:  Goal status: INITIAL  4.  *** Baseline:  Goal status: INITIAL  5.  *** Baseline:  Goal status: INITIAL  6.  *** Baseline:  Goal status: INITIAL  LONG TERM GOALS: Target date: ***  I with final HEP Baseline:  Goal status: INITIAL  2.  *** Baseline:  Goal status: INITIAL  3.  *** Baseline:  Goal status: INITIAL  4.  *** Baseline:  Goal status: INITIAL  5.  *** Baseline:  Goal status: INITIAL  6.  *** Baseline:  Goal status: INITIAL  PLAN:  PT FREQUENCY: 1-2x/week  PT DURATION: 10 weeks  PLANNED INTERVENTIONS: Therapeutic exercises, Therapeutic activity, Neuromuscular re-education, Balance training, Gait training, Patient/Family education, Self Care, Joint mobilization, Dry Needling, Electrical stimulation, Spinal mobilization, Cryotherapy, Moist heat, Vasopneumatic device, Ionotophoresis 4mg /ml Dexamethasone, and  Manual therapy  PLAN FOR NEXT SESSION: ***   Iona Beard, DPT 11/28/2022, 6:23 PM

## 2022-11-29 ENCOUNTER — Ambulatory Visit: Payer: Medicaid Other | Attending: Physician Assistant | Admitting: Physical Therapy

## 2022-11-29 DIAGNOSIS — M25512 Pain in left shoulder: Secondary | ICD-10-CM | POA: Insufficient documentation

## 2022-11-29 DIAGNOSIS — R293 Abnormal posture: Secondary | ICD-10-CM | POA: Insufficient documentation

## 2022-11-29 DIAGNOSIS — M25612 Stiffness of left shoulder, not elsewhere classified: Secondary | ICD-10-CM | POA: Insufficient documentation

## 2022-11-30 ENCOUNTER — Ambulatory Visit: Payer: Medicaid Other

## 2022-11-30 DIAGNOSIS — M25612 Stiffness of left shoulder, not elsewhere classified: Secondary | ICD-10-CM

## 2022-11-30 DIAGNOSIS — R293 Abnormal posture: Secondary | ICD-10-CM | POA: Diagnosis present

## 2022-11-30 DIAGNOSIS — M25512 Pain in left shoulder: Secondary | ICD-10-CM | POA: Diagnosis present

## 2022-11-30 NOTE — Therapy (Addendum)
OUTPATIENT PHYSICAL THERAPY SHOULDER EVALUATION   Patient Name: Jody Brooks MRN: [id number] DOB:11-10-1960, 62 y.o., female Today's Date: 11/30/2022  END OF SESSION:  PT End of Session - 11/30/22 1356     Visit Number 1    Date for PT Re-Evaluation 02/08/23    Authorization Type [place] [employer]    PT Start Time 1400    PT Stop Time 1445    PT Time Calculation (min) 45 min    Activity Tolerance Patient tolerated treatment well    Behavior During Therapy WFL for tasks assessed/performed             Past Medical History:  Diagnosis Date   Allergy    Asthma    mild   Chest pain    Hyperlipidemia    Palpitations    Seasonal allergies    Past Surgical History:  Procedure Laterality Date   ABDOMINAL HYSTERECTOMY  05/03/2003   KNEE SURGERY Bilateral    NASAL SINUS SURGERY  2010   TUBAL LIGATION     Patient Active Problem List   Diagnosis Date Noted   Bronchiectasis without acute exacerbation (HCC) 03/18/2014    PCP: Norm Salt, PA  REFERRING PROVIDER: Vernetta Honey, PA-C  REFERRING DIAG:  Lt adhesive capsulitis    THERAPY DIAG:  Acute pain of left shoulder  Stiffness of left shoulder, not elsewhere classified  Abnormal posture  Rationale for Evaluation and Treatment: Rehabilitation  ONSET DATE: 11/17/22  SUBJECTIVE:                                                                                                                                                                                      SUBJECTIVE STATEMENT: I been having problem with my arm and shoulder on the left side. It got better after I got the shot last week. I have to use my R side to help lift my L arm up. If I let it go it feels like aching and weakness.  Hand dominance: Right  PERTINENT HISTORY: Per primary Dr note referring patient to ortho Shoulder:       Shoulder: left.        Inspection: Normal Shoulder Contour.        Range of Motion: painful ROM BUT FULL.         Strength: diminished.        Palpation: no crepitations, tenderness, subacromial region, moderate.        Painful arc: 90 degrees.    PAIN:  Are you having pain? Yes: NPRS scale: 10 when it hurts/10 Pain location: L shoulder and down to elbow Pain description: ache, weak Aggravating factors: reaching over  head  Relieving factors: the shot helped, herbal topicals  PRECAUTIONS: None  RED FLAGS: None   WEIGHT BEARING RESTRICTIONS: No  FALLS:  Has patient fallen in last 6 months? No  LIVING ENVIRONMENT: Lives with: lives with their family Lives in: House/apartment  OCCUPATION: Retired  PLOF: Independent  PATIENT GOALS: go back normal and no pain   NEXT MD VISIT:   OBJECTIVE:   DIAGNOSTIC FINDINGS:  Normal X-ray findings   PATIENT SURVEYS:  FOTO 56  COGNITION: Overall cognitive status: Within functional limits for tasks assessed     SENSATION: WFL  POSTURE: Rounded shoulders, very tight in upper traps   UPPER EXTREMITY ROM:  FULL ROM but painful    UPPER EXTREMITY MMT:  MMT Right eval Left eval  Shoulder flexion 5 3+ with pain  Shoulder extension    Shoulder abduction 5 3+ with pain  Shoulder adduction    Shoulder internal rotation 5 4  Shoulder external rotation 5 4  Middle trapezius    Lower trapezius    Elbow flexion 5 5  Elbow extension 5 5  Wrist flexion    Wrist extension    Wrist ulnar deviation    Wrist radial deviation    Wrist pronation    Wrist supination    Grip strength (lbs)    (Blank rows = not tested)  SHOULDER SPECIAL TESTS: Impingement tests: Neer impingement test: negative, Hawkins/Kennedy impingement test: negative, and Painful arc test: positive   Rotator cuff assessment: Empty can test: positive   JOINT MOBILITY TESTING:  Full ROM, pain with abduction   PALPATION:  no crepitations, tenderness, subacromial region, moderate. Lots of tightness in upper traps, mostly on L side    TODAY'S TREATMENT:                                                                                                                                          DATE:  11/30/22 Eval, Education, HEP    PATIENT EDUCATION: Education details: POC Person educated: Patient Education method: Explanation Education comprehension: verbalized understanding  HOME EXERCISE PROGRAM: Access Code: [email or web address] URL: [email or web address] Date: 11/30/2022 Prepared by: Cassie Freer  Exercises - Standing Shoulder Row with Anchored Resistance  - 1 x daily - 7 x weekly - 2 sets - 10 reps - Shoulder extension with resistance - Neutral  - 1 x daily - 7 x weekly - 2 sets - 10 reps - Standing Shoulder Flexion with Resistance  - 1 x daily - 7 x weekly - 2 sets - 10 reps - Standing Single Arm Shoulder Abduction with Resistance  - 1 x daily - 7 x weekly - 2 sets - 10 reps - Seated Upper Trapezius Stretch  - 1 x daily - 7 x weekly - 2 reps - 30 hold  ASSESSMENT:  CLINICAL IMPRESSION: Patient is a 62 y.o. who was seen today for physical therapy evaluation and  treatment for L shoulder pain. She reports her pain has been ongoing for 4 months now and she does not recall doing anything to hurt it. She presents with positive painful arc but is able to demonstrate full ROM. She does have some strength deficits especially into flexion and abduction. She is very tight in bilateral upper traps. She states she used to get monthly massages and feels like her shoulder pain may have started because she stopped going for these. Patient reports her pain is mostly there when she tries to move her arm and especially with overhead movements. She will benefit from skilled PT to address her L shoulder pain and work on her strength.   OBJECTIVE IMPAIRMENTS: decreased strength, hypomobility, impaired flexibility, impaired UE functional use, and improper body mechanics.   ACTIVITY LIMITATIONS: carrying, lifting, bathing, dressing, reach over head, and  hygiene/grooming  PARTICIPATION LIMITATIONS: cleaning, laundry, driving, and community activity  REHAB POTENTIAL: Good  CLINICAL DECISION MAKING: Stable/uncomplicated  EVALUATION COMPLEXITY: Low   GOALS: GOALS: Goals reviewed with patient? Yes  SHORT TERM GOALS: Target date: 01/04/23  Patient will be independent with initial HEP.  Goal status: INITIAL  LONG TERM GOALS: Target date: 02/08/23  Patient will be independent with advanced/ongoing HEP to improve outcomes and carryover.  Goal status: INITIAL  2.  Patient will report 75% improvement in L shoulder pain to improve QOL.  Baseline: 10/10 pain when it hurts  Goal status: INITIAL  3.  Patient to demonstrate improved upright posture with posterior shoulder girdle engaged to promote improved glenohumeral joint mobility. Goal status: INITIAL  4.  Patient will demonstrate improved functional UE strength as demonstrated by 5/5. Baseline: 3+/5  Goal status: INITIAL  5.  Patient will report 26 on FOTO(patient outcome measure)  to demonstrate improved functional ability.  Baseline: 56 Goal status: INITIAL  PLAN:  PT FREQUENCY: 1-2x/week  PT DURATION: 10 weeks  PLANNED INTERVENTIONS: Therapeutic exercises, Therapeutic activity, Neuromuscular re-education, Balance training, Gait training, Patient/Family education, Self Care, Joint mobilization, Dry Needling, Electrical stimulation, Spinal mobilization, Cryotherapy, Moist heat, Vasopneumatic device, Ionotophoresis 4mg /ml Dexamethasone, and Manual therapy  PLAN FOR NEXT SESSION: L shoulder strengthening, may benefit from DN to upper traps   **no vaso, traction, ionto**  11/30/2022, 2:36 PM

## 2022-12-05 ENCOUNTER — Ambulatory Visit: Payer: Medicaid Other | Attending: Physician Assistant | Admitting: Physical Therapy

## 2022-12-05 DIAGNOSIS — M25612 Stiffness of left shoulder, not elsewhere classified: Secondary | ICD-10-CM | POA: Insufficient documentation

## 2022-12-05 DIAGNOSIS — M25512 Pain in left shoulder: Secondary | ICD-10-CM | POA: Insufficient documentation

## 2022-12-05 DIAGNOSIS — R293 Abnormal posture: Secondary | ICD-10-CM | POA: Insufficient documentation

## 2022-12-07 ENCOUNTER — Other Ambulatory Visit: Payer: Self-pay

## 2022-12-07 ENCOUNTER — Ambulatory Visit: Payer: Medicaid Other

## 2022-12-07 DIAGNOSIS — R293 Abnormal posture: Secondary | ICD-10-CM | POA: Diagnosis present

## 2022-12-07 DIAGNOSIS — M25512 Pain in left shoulder: Secondary | ICD-10-CM | POA: Diagnosis present

## 2022-12-07 DIAGNOSIS — M25612 Stiffness of left shoulder, not elsewhere classified: Secondary | ICD-10-CM

## 2022-12-07 NOTE — Therapy (Signed)
OUTPATIENT PHYSICAL THERAPY SHOULDER TRATMENT   Patient Name: Jody Brooks MRN: [id number] DOB:[birth year], 62 y.o., female Today's Date: 12/07/2022  END OF SESSION:  PT End of Session - 12/07/22 1327     Visit Number 2    Date for PT Re-Evaluation 02/08/23    Authorization Type [place] [employer]    Progress Note Due on Visit 10    PT Start Time 1258    PT Stop Time 1336    PT Time Calculation (min) 38 min    Activity Tolerance Patient tolerated treatment well    Behavior During Therapy WFL for tasks assessed/performed              Past Medical History:  Diagnosis Date   Allergy    Asthma    mild   Chest pain    Hyperlipidemia    Palpitations    Seasonal allergies    Past Surgical History:  Procedure Laterality Date   ABDOMINAL HYSTERECTOMY  05/03/2003   KNEE SURGERY Bilateral    NASAL SINUS SURGERY  2010   TUBAL LIGATION     Patient Active Problem List   Diagnosis Date Noted   Bronchiectasis without acute exacerbation (HCC) 03/18/2014    PCP: Norm Salt, PA  REFERRING PROVIDER: Vernetta Honey, PA-C  REFERRING DIAG:  Lt adhesive capsulitis    THERAPY DIAG:  Acute pain of left shoulder  Stiffness of left shoulder, not elsewhere classified  Abnormal posture  Rationale for Evaluation and Treatment: Rehabilitation  ONSET DATE: 11/17/22  SUBJECTIVE:                                                                                                                                                                                      SUBJECTIVE STATEMENT: 12/07/22: My arm doesn't hurt at all today now, since I had that shot.  I'm using the bands at home  I been having problem with my arm and shoulder on the left side. It got better after I got the shot last week. I have to use my R side to help lift my L arm up. If I let it go it feels like aching and weakness.  Hand dominance: Right  PERTINENT HISTORY: Per primary Dr note referring patient to  ortho Shoulder:       Shoulder: left.        Inspection: Normal Shoulder Contour.        Range of Motion: painful ROM BUT FULL.        Strength: diminished.        Palpation: no crepitations, tenderness, subacromial region, moderate.        Painful arc: 90 degrees.  PAIN:  Are you having pain? Yes: NPRS scale: 10 when it hurts/10 Pain location: L shoulder and down to elbow Pain description: ache, weak Aggravating factors: reaching over head  Relieving factors: the shot helped, herbal topicals  PRECAUTIONS: None  RED FLAGS: None   WEIGHT BEARING RESTRICTIONS: No  FALLS:  Has patient fallen in last 6 months? No  LIVING ENVIRONMENT: Lives with: lives with their family Lives in: House/apartment  OCCUPATION: Retired  PLOF: Independent  PATIENT GOALS: go back normal and no pain   NEXT MD VISIT:   OBJECTIVE:   DIAGNOSTIC FINDINGS:  Normal X-ray findings   PATIENT SURVEYS:  FOTO 56  COGNITION: Overall cognitive status: Within functional limits for tasks assessed     SENSATION: WFL  POSTURE: Rounded shoulders, very tight in upper traps   UPPER EXTREMITY ROM:  FULL ROM but painful    UPPER EXTREMITY MMT:  MMT Right eval Left eval  Shoulder flexion 5 3+ with pain  Shoulder extension    Shoulder abduction 5 3+ with pain  Shoulder adduction    Shoulder internal rotation 5 4  Shoulder external rotation 5 4  Middle trapezius    Lower trapezius    Elbow flexion 5 5  Elbow extension 5 5  Wrist flexion    Wrist extension    Wrist ulnar deviation    Wrist radial deviation    Wrist pronation    Wrist supination    Grip strength (lbs)    (Blank rows = not tested)  SHOULDER SPECIAL TESTS: Impingement tests: Neer impingement test: negative, Hawkins/Kennedy impingement test: negative, and Painful arc test: positive   Rotator cuff assessment: Empty can test: positive   JOINT MOBILITY TESTING:  Full ROM, pain with abduction   PALPATION:  no  crepitations, tenderness, subacromial region, moderate. Lots of tightness in upper traps, mostly on L side    TODAY'S TREATMENT:                                                                                                                                         DATE:  Manual:   Reassessed flexibility L shoulder, in supine limited IR by 15 degrees\ Active L shoulder flex and abd limited 15 degrees compared to R Tender L infraspinatus Supine for inf glides L humeral head, post capsule stretch,    Prone for PA glides, HVLA upper /mid thoracic spine, also cervicothoracic jxn  Trigger Point Dry-Needling  Treatment instructions: Expect mild to moderate muscle soreness. S/S of pneumothorax if dry needled over a lung field, and to seek immediate medical attention should they occur. Patient verbalized understanding of these instructions and education. Patient Consent Given: Yes Education handout provided: No Muscles treated: L infraspinatus, L teres minor Treatment response/outcome: Twitch Response Elicited and Palpable Increase in Muscle Length   Therex:  inst in wall slides, to maintain L shoulder elevation ROM/ flexibility, patient deferred  review of therex with theraband Supine for medial scapular glides with manual stretching L shoulder into flexion/abd  11/30/22 Eval, Education, HEP    PATIENT EDUCATION: Education details: POC Person educated: Patient Education method: Explanation Education comprehension: verbalized understanding  HOME EXERCISE PROGRAM: Access Code: [email or web address] URL: [email or web address] Date: 11/30/2022 Prepared by: Cassie Freer  Exercises - Standing Shoulder Row with Anchored Resistance  - 1 x daily - 7 x weekly - 2 sets - 10 reps - Shoulder extension with resistance - Neutral  - 1 x daily - 7 x weekly - 2 sets - 10 reps - Standing Shoulder Flexion with Resistance  - 1 x daily - 7 x weekly - 2 sets - 10 reps - Standing Single Arm Shoulder  Abduction with Resistance  - 1 x daily - 7 x weekly - 2 sets - 10 reps - Seated Upper Trapezius Stretch  - 1 x daily - 7 x weekly - 2 reps - 30 hold  ASSESSMENT:  CLINICAL IMPRESSION: Patient is a 62 y.o. who participated in treatment today for L shoulder pain. Her sx have nearly completely resolved today since the effects of the injection have improved since last week.  Her flexibility is slightly limited L shoulder flex and abd and she has some dysfunction L infraspinatus as well as corresponding stiffness mid thoracic spine.  Focused on manual techniques today to alleviate these problems as well as emphasized/educated her to continue to move her L arm to maintain her flexibility. She will benefit from skilled PT to address her L shoulder pain and work on her strength. We discussed spacing out or reducing her frequency today due to her improvement so she will come once next week.   OBJECTIVE IMPAIRMENTS: decreased strength, hypomobility, impaired flexibility, impaired UE functional use, and improper body mechanics.   ACTIVITY LIMITATIONS: carrying, lifting, bathing, dressing, reach over head, and hygiene/grooming  PARTICIPATION LIMITATIONS: cleaning, laundry, driving, and community activity  REHAB POTENTIAL: Good  CLINICAL DECISION MAKING: Stable/uncomplicated  EVALUATION COMPLEXITY: Low   GOALS: GOALS: Goals reviewed with patient? Yes  SHORT TERM GOALS: Target date: 01/04/23  Patient will be independent with initial HEP.  Goal status: INITIAL  LONG TERM GOALS: Target date: 02/08/23  Patient will be independent with advanced/ongoing HEP to improve outcomes and carryover.  Goal status: INITIAL  2.  Patient will report 75% improvement in L shoulder pain to improve QOL.  Baseline: 10/10 pain when it hurts  Goal status: INITIAL  3.  Patient to demonstrate improved upright posture with posterior shoulder girdle engaged to promote improved glenohumeral joint mobility. Goal status:  INITIAL  4.  Patient will demonstrate improved functional UE strength as demonstrated by 5/5. Baseline: 3+/5  Goal status: INITIAL  5.  Patient will report 67 on FOTO(patient outcome measure)  to demonstrate improved functional ability.  Baseline: 56 Goal status: INITIAL  PLAN:  PT FREQUENCY: 1-2x/week  PT DURATION: 10 weeks  PLANNED INTERVENTIONS: Therapeutic exercises, Therapeutic activity, Neuromuscular re-education, Balance training, Gait training, Patient/Family education, Self Care, Joint mobilization, Dry Needling, Electrical stimulation, Spinal mobilization, Cryotherapy, Moist heat, Vasopneumatic device, Ionotophoresis 4mg /ml Dexamethasone, and Manual therapy  PLAN FOR NEXT SESSION: L shoulder strengthening, may benefit from DN to upper traps   **no vaso, traction, ionto**  12/07/2022, 4:57 PM

## 2022-12-12 ENCOUNTER — Ambulatory Visit: Payer: Medicaid Other | Admitting: Physical Therapy

## 2022-12-14 ENCOUNTER — Ambulatory Visit: Payer: Medicaid Other

## 2022-12-14 ENCOUNTER — Other Ambulatory Visit: Payer: Self-pay

## 2022-12-14 DIAGNOSIS — M25512 Pain in left shoulder: Secondary | ICD-10-CM | POA: Diagnosis not present

## 2022-12-14 DIAGNOSIS — M25612 Stiffness of left shoulder, not elsewhere classified: Secondary | ICD-10-CM

## 2022-12-14 DIAGNOSIS — R293 Abnormal posture: Secondary | ICD-10-CM

## 2022-12-14 NOTE — Therapy (Signed)
OUTPATIENT PHYSICAL THERAPY SHOULDER TREATMENT   Patient Name: Jody Brooks MRN: [id number] DOB:1961/04/22, 62 y.o., female Today's Date: 12/14/2022  END OF SESSION:  PT End of Session - 12/14/22 1305     Visit Number 3    Date for PT Re-Evaluation 02/08/23    Progress Note Due on Visit 10    PT Start Time 1300    PT Stop Time 1345    PT Time Calculation (min) 45 min    Activity Tolerance Patient tolerated treatment well    Behavior During Therapy [facility] for tasks assessed/performed               Past Medical History:  Diagnosis Date   Allergy    Asthma    mild   Chest pain    Hyperlipidemia    Palpitations    Seasonal allergies    Past Surgical History:  Procedure Laterality Date   ABDOMINAL HYSTERECTOMY  05/03/2003   KNEE SURGERY Bilateral    NASAL SINUS SURGERY  2010   TUBAL LIGATION     Patient Active Problem List   Diagnosis Date Noted   Bronchiectasis without acute exacerbation (HCC) 03/18/2014    PCP: Norm Salt, PA  REFERRING PROVIDER: Vernetta Honey, PA-C  REFERRING DIAG:  Lt adhesive capsulitis    THERAPY DIAG:  No diagnosis found.  Rationale for Evaluation and Treatment: Rehabilitation  ONSET DATE: 11/17/22  SUBJECTIVE:                                                                                                                                                                                      SUBJECTIVE STATEMENT: 12/07/22: My arm doesn't hurt at all today now, since I had that shot.  I'm using the bands at home  I been having problem with my arm and shoulder on the left side. It got better after I got the shot last week. I have to use my R side to help lift my L arm up. If I let it go it feels like aching and weakness.  Hand dominance: Right  PERTINENT HISTORY: Per primary Dr note referring patient to ortho Shoulder:       Shoulder: left.        Inspection: Normal Shoulder Contour.        Range of Motion:  painful ROM BUT FULL.        Strength: diminished.        Palpation: no crepitations, tenderness, subacromial region, moderate.        Painful arc: 90 degrees.    PAIN:  Are you having pain? Yes: NPRS scale: 10 when it hurts/10 Pain location: L  shoulder and down to elbow Pain description: ache, weak Aggravating factors: reaching over head  Relieving factors: the shot helped, herbal topicals  PRECAUTIONS: None  RED FLAGS: None   WEIGHT BEARING RESTRICTIONS: No  FALLS:  Has patient fallen in last 6 months? No  LIVING ENVIRONMENT: Lives with: lives with their family Lives in: House/apartment  OCCUPATION: Retired  PLOF: Independent  PATIENT GOALS: go back normal and no pain   NEXT MD VISIT:   OBJECTIVE:   DIAGNOSTIC FINDINGS:  Normal X-ray findings   PATIENT SURVEYS:  FOTO 56  COGNITION: Overall cognitive status: Within functional limits for tasks assessed     SENSATION: WFL  POSTURE: Rounded shoulders, very tight in upper traps   UPPER EXTREMITY ROM:  FULL ROM but painful    UPPER EXTREMITY MMT:  MMT Right eval Left eval  Shoulder flexion 5 3+ with pain  Shoulder extension    Shoulder abduction 5 3+ with pain  Shoulder adduction    Shoulder internal rotation 5 4  Shoulder external rotation 5 4  Middle trapezius    Lower trapezius    Elbow flexion 5 5  Elbow extension 5 5  Wrist flexion    Wrist extension    Wrist ulnar deviation    Wrist radial deviation    Wrist pronation    Wrist supination    Grip strength (lbs)    (Blank rows = not tested)  SHOULDER SPECIAL TESTS: Impingement tests: Neer impingement test: negative, Hawkins/Kennedy impingement test: negative, and Painful arc test: positive   Rotator cuff assessment: Empty can test: positive   JOINT MOBILITY TESTING:  Full ROM, pain with abduction   PALPATION:  no crepitations, tenderness, subacromial region, moderate. Lots of tightness in upper traps, mostly on L side     TODAY'S TREATMENT:                                                                                                                                         DATE:  12/14/22:  Reassessed shoulder mobility, primarily manual techniques today to address terminal L shoulder flexion and abd, also IR motion Supine for inf humeral glides, gr 3-4 ,3 bouts Supine distraction L humerus with AAROM/ stretch elevation   Prone for DN for L infraspinatus, L teres minor, L upper traps, twitch response noted for all  HVLA for PA mobs mid thoracic, also cervico thoracic jxn  Therex : prone L shoulder horizontal abd, also prone l shoulder ext manual light resistance from PT.  15 x Seated yellow t band B shoulder ER and B shoulder ext, 15 x each  12/07/22: Manual:   Reassessed flexibility L shoulder, in supine limited IR by 15 degrees\ Active L shoulder flex and abd limited 15 degrees compared to R Tender L infraspinatus Supine for inf glides L humeral head, post capsule stretch,    Prone for PA glides, HVLA upper /mid  thoracic spine, also cervicothoracic jxn  Trigger Point Dry-Needling  Treatment instructions: Expect mild to moderate muscle soreness. S/S of pneumothorax if dry needled over a lung field, and to seek immediate medical attention should they occur. Patient verbalized understanding of these instructions and education. Patient Consent Given: Yes Education handout provided: No Muscles treated: L infraspinatus, L teres minor Treatment response/outcome: Twitch Response Elicited and Palpable Increase in Muscle Length   Therex:  inst in wall slides, to maintain L shoulder elevation ROM/ flexibility, patient deferred review of therex with theraband Supine for medial scapular glides with manual stretching L shoulder into flexion/abd  11/30/22 Eval, Education, HEP    PATIENT EDUCATION: Education details: POC Person educated: Patient Education method: Explanation Education comprehension:  verbalized understanding  HOME EXERCISE PROGRAM: Access Code: [email or web address] URL: [email or web address] Date: 11/30/2022 Prepared by: Cassie Freer  Exercises - Standing Shoulder Row with Anchored Resistance  - 1 x daily - 7 x weekly - 2 sets - 10 reps - Shoulder extension with resistance - Neutral  - 1 x daily - 7 x weekly - 2 sets - 10 reps - Standing Shoulder Flexion with Resistance  - 1 x daily - 7 x weekly - 2 sets - 10 reps - Standing Single Arm Shoulder Abduction with Resistance  - 1 x daily - 7 x weekly - 2 sets - 10 reps - Seated Upper Trapezius Stretch  - 1 x daily - 7 x weekly - 2 reps - 30 hold  ASSESSMENT:  CLINICAL IMPRESSION: Patient is a 62 y.o. who participated in treatment today for L shoulder pain. Her sx have nearly completely resolved today since the effects of the injection have improved since last week.  Her flexibility is slightly limited L shoulder flex and abd and she has some dysfunction L infraspinatus as well as corresponding stiffness mid thoracic spine.  Focused on manual techniques today to alleviate these problems as well as emphasized/educated her to continue to move her L arm to maintain her flexibility. She will benefit from skilled PT to address her L shoulder pain and work on her strength. We discussed spacing out or reducing her frequency today again due to her improvement so she will come once next week. She has less tenderness L infraspinatus today  OBJECTIVE IMPAIRMENTS: decreased strength, hypomobility, impaired flexibility, impaired UE functional use, and improper body mechanics.   ACTIVITY LIMITATIONS: carrying, lifting, bathing, dressing, reach over head, and hygiene/grooming  PARTICIPATION LIMITATIONS: cleaning, laundry, driving, and community activity  REHAB POTENTIAL: Good  CLINICAL DECISION MAKING: Stable/uncomplicated  EVALUATION COMPLEXITY: Low   GOALS: GOALS: Goals reviewed with patient? Yes  SHORT TERM GOALS: Target  date: 01/04/23  Patient will be independent with initial HEP.  Goal status: INITIAL  LONG TERM GOALS: Target date: 02/08/23  Patient will be independent with advanced/ongoing HEP to improve outcomes and carryover.  Goal status: INITIAL  2.  Patient will report 75% improvement in L shoulder pain to improve QOL.  Baseline: 10/10 pain when it hurts  Goal status: INITIAL  3.  Patient to demonstrate improved upright posture with posterior shoulder girdle engaged to promote improved glenohumeral joint mobility. Goal status: INITIAL  4.  Patient will demonstrate improved functional UE strength as demonstrated by 5/5. Baseline: 3+/5  Goal status: INITIAL  5.  Patient will report 105 on FOTO(patient outcome measure)  to demonstrate improved functional ability.  Baseline: 56 Goal status: INITIAL  PLAN:  PT FREQUENCY: 1-2x/week  PT DURATION: 10 weeks  PLANNED INTERVENTIONS: Therapeutic exercises,  Therapeutic activity, Neuromuscular re-education, Balance training, Gait training, Patient/Family education, Self Care, Joint mobilization, Dry Needling, Electrical stimulation, Spinal mobilization, Cryotherapy, Moist heat, Vasopneumatic device, Ionotophoresis 4mg /ml Dexamethasone, and Manual therapy  PLAN FOR NEXT SESSION: L shoulder strengthening, may benefit from DN to upper traps   **no vaso, traction, ionto**  12/14/2022, 1:07 PM

## 2022-12-19 ENCOUNTER — Ambulatory Visit: Payer: Medicaid Other

## 2022-12-21 ENCOUNTER — Ambulatory Visit: Payer: Medicaid Other

## 2022-12-21 ENCOUNTER — Other Ambulatory Visit: Payer: Self-pay

## 2022-12-21 DIAGNOSIS — M25612 Stiffness of left shoulder, not elsewhere classified: Secondary | ICD-10-CM

## 2022-12-21 DIAGNOSIS — M25512 Pain in left shoulder: Secondary | ICD-10-CM | POA: Diagnosis not present

## 2022-12-21 DIAGNOSIS — R293 Abnormal posture: Secondary | ICD-10-CM

## 2022-12-21 NOTE — Therapy (Signed)
OUTPATIENT PHYSICAL THERAPY SHOULDER TREATMENT   Patient Name: Jody Brooks MRN: [id number] DOB:[birth year], 62 y.o., female Today's Date: 12/21/2022  END OF SESSION:  PT End of Session - 12/21/22 1332     Visit Number 4    Date for PT Re-Evaluation 02/08/23    Authorization Type [place] [employer]    PT Start Time 1258    PT Stop Time 1330    PT Time Calculation (min) 32 min                Past Medical History:  Diagnosis Date   Allergy    Asthma    mild   Chest pain    Hyperlipidemia    Palpitations    Seasonal allergies    Past Surgical History:  Procedure Laterality Date   ABDOMINAL HYSTERECTOMY  05/03/2003   KNEE SURGERY Bilateral    NASAL SINUS SURGERY  2010   TUBAL LIGATION     Patient Active Problem List   Diagnosis Date Noted   Bronchiectasis without acute exacerbation (HCC) 03/18/2014    PCP: Norm Salt, PA  REFERRING PROVIDER: Vernetta Honey, PA-C  REFERRING DIAG:  Lt adhesive capsulitis    THERAPY DIAG:  Acute pain of left shoulder  Stiffness of left shoulder, not elsewhere classified  Abnormal posture  Rationale for Evaluation and Treatment: Rehabilitation  ONSET DATE: 11/17/22  SUBJECTIVE:                                                                                                                                                                                      SUBJECTIVE STATEMENT: 12/21/22: My arm doesn't hurt at all today now, since I had that shot. I'm feeling much better, occasional pull with reaching out lateral upper arm.  Sleeping well.  I been having problem with my arm and shoulder on the left side. It got better after I got the shot last week. I have to use my R side to help lift my L arm up. If I let it go it feels like aching and weakness.  Hand dominance: Right  PERTINENT HISTORY: Per primary Dr note referring patient to ortho Shoulder:       Shoulder: left.        Inspection: Normal Shoulder  Contour.        Range of Motion: painful ROM BUT FULL.        Strength: diminished.        Palpation: no crepitations, tenderness, subacromial region, moderate.        Painful arc: 90 degrees.    PAIN:  Are you having pain? Yes: NPRS scale: 10 when it hurts/10 Pain location:  L shoulder and down to elbow Pain description: ache, weak Aggravating factors: reaching over head  Relieving factors: the shot helped, herbal topicals  PRECAUTIONS: None  RED FLAGS: None   WEIGHT BEARING RESTRICTIONS: No  FALLS:  Has patient fallen in last 6 months? No  LIVING ENVIRONMENT: Lives with: lives with their family Lives in: House/apartment  OCCUPATION: Retired  PLOF: Independent  PATIENT GOALS: go back normal and no pain   NEXT MD VISIT:   OBJECTIVE:   DIAGNOSTIC FINDINGS:  Normal X-ray findings   PATIENT SURVEYS:  FOTO 56  COGNITION: Overall cognitive status: Within functional limits for tasks assessed     SENSATION: WFL  POSTURE: Rounded shoulders, very tight in upper traps   UPPER EXTREMITY ROM:  FULL ROM but painful    UPPER EXTREMITY MMT:  MMT Right eval Left eval  Shoulder flexion 5 3+ with pain  Shoulder extension    Shoulder abduction 5 3+ with pain  Shoulder adduction    Shoulder internal rotation 5 4  Shoulder external rotation 5 4  Middle trapezius    Lower trapezius    Elbow flexion 5 5  Elbow extension 5 5  Wrist flexion    Wrist extension    Wrist ulnar deviation    Wrist radial deviation    Wrist pronation    Wrist supination    Grip strength (lbs)    (Blank rows = not tested)  SHOULDER SPECIAL TESTS: Impingement tests: Neer impingement test: negative, Hawkins/Kennedy impingement test: negative, and Painful arc test: positive   Rotator cuff assessment: Empty can test: positive   JOINT MOBILITY TESTING:  Full ROM, pain with abduction   PALPATION:  no crepitations, tenderness, subacromial region, moderate. Lots of tightness in upper  traps, mostly on L side    TODAY'S TREATMENT:                                                                                                                                         DATE:  12/21/22: Manual:   HVLA for PA mobs mid thoracic, also cervico thoracic jxn Supine for inf humeral glides, gr 3-4 ,3 bouts  Prone for cross friction massage L infraspinatus musculature  Supine distraction L humerus with AAROM/ stretch elevation all planes, primarily flexion terminal  12/14/22:  Reassessed shoulder mobility, primarily manual techniques today to address terminal L shoulder flexion and abd, also IR motion Supine for inf humeral glides, gr 3-4 ,3 bouts Supine distraction L humerus with AAROM/ stretch elevation   Prone for DN for L infraspinatus, L teres minor, L upper traps, twitch response noted for all  HVLA for PA mobs mid thoracic, also cervico thoracic jxn  Therex : prone L shoulder horizontal abd, also prone l shoulder ext manual light resistance from PT.  15 x Seated yellow t band B shoulder ER and B shoulder ext, 15 x each  12/07/22: Manual:  Reassessed flexibility L shoulder, in supine limited IR by 15 degrees\ Active L shoulder flex and abd limited 15 degrees compared to R Tender L infraspinatus Supine for inf glides L humeral head, post capsule stretch,    Prone for PA glides, HVLA upper /mid thoracic spine, also cervicothoracic jxn  Trigger Point Dry-Needling  Treatment instructions: Expect mild to moderate muscle soreness. S/S of pneumothorax if dry needled over a lung field, and to seek immediate medical attention should they occur. Patient verbalized understanding of these instructions and education. Patient Consent Given: Yes Education handout provided: No Muscles treated: L infraspinatus, L teres minor Treatment response/outcome: Twitch Response Elicited and Palpable Increase in Muscle Length   Therex:  inst in wall slides, to maintain L shoulder elevation ROM/  flexibility, patient deferred review of therex with theraband Supine for medial scapular glides with manual stretching L shoulder into flexion/abd  11/30/22 Eval, Education, HEP    PATIENT EDUCATION: Education details: POC Person educated: Patient Education method: Explanation Education comprehension: verbalized understanding  HOME EXERCISE PROGRAM: Access Code: [email or web address] URL: [email or web address] Date: 11/30/2022 Prepared by: Cassie Freer  Exercises - Standing Shoulder Row with Anchored Resistance  - 1 x daily - 7 x weekly - 2 sets - 10 reps - Shoulder extension with resistance - Neutral  - 1 x daily - 7 x weekly - 2 sets - 10 reps - Standing Shoulder Flexion with Resistance  - 1 x daily - 7 x weekly - 2 sets - 10 reps - Standing Single Arm Shoulder Abduction with Resistance  - 1 x daily - 7 x weekly - 2 sets - 10 reps - Seated Upper Trapezius Stretch  - 1 x daily - 7 x weekly - 2 reps - 30 hold  ASSESSMENT:  CLINICAL IMPRESSION: Patient is a 62 y.o. who participated in treatment today for L shoulder pain. Her sx have nearly completely resolved today. Her flexibility is slightly limited L shoulder flex               Focused again on manual techniques today to alleviate these problems as well as emphasized/educated her to continue to move her L arm to maintain her flexibility. Reassessment of strength revealed some pain and weakness with resisted L shoulder flexion.  IR , ER strength wnl today L shoulder.She deferred the dry needling today.  She will be out of town next week.  Will hold her chart at this time.  She wants to be able to come back if needed for reassessment within the next 1 - 2 months in case her Sx increase again. OBJECTIVE IMPAIRMENTS: decreased strength, hypomobility, impaired flexibility, impaired UE functional use, and improper body mechanics.   ACTIVITY LIMITATIONS: carrying, lifting, bathing, dressing, reach over head, and  hygiene/grooming  PARTICIPATION LIMITATIONS: cleaning, laundry, driving, and community activity  REHAB POTENTIAL: Good  CLINICAL DECISION MAKING: Stable/uncomplicated  EVALUATION COMPLEXITY: Low   GOALS: GOALS: Goals reviewed with patient? Yes  SHORT TERM GOALS: Target date: 01/04/23  Patient will be independent with initial HEP.  Goal status: INITIAL  LONG TERM GOALS: Target date: 02/08/23  Patient will be independent with advanced/ongoing HEP to improve outcomes and carryover.  Goal status: INITIAL  2.  Patient will report 75% improvement in L shoulder pain to improve QOL.  Baseline: 10/10 pain when it hurts  Goal status: INITIAL  3.  Patient to demonstrate improved upright posture with posterior shoulder girdle engaged to promote improved glenohumeral joint mobility. Goal status: INITIAL  4.  Patient  will demonstrate improved functional UE strength as demonstrated by 5/5. Baseline: 3+/5  Goal status: INITIAL  5.  Patient will report 70 on FOTO(patient outcome measure)  to demonstrate improved functional ability.  Baseline: 56 Goal status: INITIAL  PLAN:  PT FREQUENCY: 1-2x/week  PT DURATION: 10 weeks  PLANNED INTERVENTIONS: Therapeutic exercises, Therapeutic activity, Neuromuscular re-education, Balance training, Gait training, Patient/Family education, Self Care, Joint mobilization, Dry Needling, Electrical stimulation, Spinal mobilization, Cryotherapy, Moist heat, Vasopneumatic device, Ionotophoresis 4mg /ml Dexamethasone, and Manual therapy  PLAN FOR NEXT SESSION: L shoulder strengthening, may benefit from DN to upper traps   **no vaso, traction, ionto**  12/21/2022, 5:10 PM

## 2022-12-26 ENCOUNTER — Ambulatory Visit: Payer: Medicaid Other

## 2022-12-28 ENCOUNTER — Ambulatory Visit: Payer: Medicaid Other

## 2023-04-19 ENCOUNTER — Other Ambulatory Visit: Payer: BC Managed Care – PPO

## 2023-05-18 ENCOUNTER — Ambulatory Visit
Admission: RE | Admit: 2023-05-18 | Discharge: 2023-05-18 | Disposition: A | Discharge status: Home / Self Care | Payer: BC Managed Care – PPO | Source: Ambulatory Visit | Attending: Physician Assistant | Admitting: Physician Assistant

## 2023-05-18 DIAGNOSIS — N6310 Unspecified lump in the right breast, unspecified quadrant: Secondary | ICD-10-CM
